# Patient Record
Sex: Male | Born: 1970 | Race: White | Hispanic: No | Marital: Single | Smoking: Current every day smoker
Health system: Southern US, Community
[De-identification: ages and names within clinical notes are randomized; demographics above are authoritative.]

## PROBLEM LIST (undated history)

## (undated) DIAGNOSIS — F429 Obsessive-compulsive disorder, unspecified: Secondary | ICD-10-CM

## (undated) DIAGNOSIS — F319 Bipolar disorder, unspecified: Secondary | ICD-10-CM

## (undated) DIAGNOSIS — F329 Major depressive disorder, single episode, unspecified: Secondary | ICD-10-CM

## (undated) DIAGNOSIS — F32A Depression, unspecified: Secondary | ICD-10-CM

## (undated) DIAGNOSIS — F419 Anxiety disorder, unspecified: Secondary | ICD-10-CM

## (undated) HISTORY — DX: Bipolar disorder, unspecified: F31.9

## (undated) HISTORY — DX: Obsessive-compulsive disorder, unspecified: F42.9

---

## 2014-03-01 ENCOUNTER — Encounter (HOSPITAL_COMMUNITY): Payer: Self-pay | Admitting: *Deleted

## 2014-03-01 ENCOUNTER — Emergency Department (HOSPITAL_COMMUNITY)
Admission: EM | Admit: 2014-03-01 | Discharge: 2014-03-01 | Disposition: A | Discharge status: Home / Self Care | Payer: Self-pay | Attending: Emergency Medicine | Admitting: Emergency Medicine

## 2014-03-01 DIAGNOSIS — Z79899 Other long term (current) drug therapy: Secondary | ICD-10-CM | POA: Insufficient documentation

## 2014-03-01 DIAGNOSIS — G8929 Other chronic pain: Secondary | ICD-10-CM | POA: Insufficient documentation

## 2014-03-01 DIAGNOSIS — S39012A Strain of muscle, fascia and tendon of lower back, initial encounter: Secondary | ICD-10-CM | POA: Insufficient documentation

## 2014-03-01 DIAGNOSIS — X58XXXA Exposure to other specified factors, initial encounter: Secondary | ICD-10-CM | POA: Insufficient documentation

## 2014-03-01 DIAGNOSIS — Y9389 Activity, other specified: Secondary | ICD-10-CM | POA: Insufficient documentation

## 2014-03-01 DIAGNOSIS — Y9289 Other specified places as the place of occurrence of the external cause: Secondary | ICD-10-CM | POA: Insufficient documentation

## 2014-03-01 DIAGNOSIS — Y998 Other external cause status: Secondary | ICD-10-CM | POA: Insufficient documentation

## 2014-03-01 DIAGNOSIS — Z720 Tobacco use: Secondary | ICD-10-CM | POA: Insufficient documentation

## 2014-03-01 MED ORDER — KETOROLAC TROMETHAMINE 10 MG PO TABS
10.0000 mg | ORAL_TABLET | Freq: Once | ORAL | Status: AC
  Administered 2014-03-01: 10 mg via ORAL
  Filled 2014-03-01: qty 1

## 2014-03-01 MED ORDER — BACLOFEN 10 MG PO TABS: 10.0000 mg | ORAL_TABLET | Freq: Three times a day (TID) | ORAL | Status: AC

## 2014-03-01 MED ORDER — DICLOFENAC SODIUM 75 MG PO TBEC: 75.0000 mg | DELAYED_RELEASE_TABLET | Freq: Two times a day (BID) | ORAL | Status: DC

## 2014-03-01 MED ORDER — METHOCARBAMOL 500 MG PO TABS
1000.0000 mg | ORAL_TABLET | Freq: Once | ORAL | Status: AC
  Administered 2014-03-01: 1000 mg via ORAL
  Filled 2014-03-01: qty 2

## 2014-03-01 MED ORDER — DEXAMETHASONE SODIUM PHOSPHATE 4 MG/ML IJ SOLN
8.0000 mg | Freq: Once | INTRAMUSCULAR | Status: AC
  Administered 2014-03-01: 8 mg via INTRAMUSCULAR
  Filled 2014-03-01: qty 2

## 2014-03-01 NOTE — Discharge Instructions (Signed)
Please rest your back is much as possible. Please apply heating pad anterior back, or soak in a tub of warm water 2 times daily for 15-20 minutes each. Please use diclofenac 2 times daily with food. Use baclofen 3 times daily for spasm. May use Tylenol in between the doses of diclofenac if needed. Please see the orthopedic specialist listed above if not improving. Lumbosacral Strain Lumbosacral strain is a strain of any of the parts that make up your lumbosacral vertebrae. Your lumbosacral vertebrae are the bones that make up the lower third of your backbone. Your lumbosacral vertebrae are held together by muscles and tough, fibrous tissue (ligaments).  CAUSES  A sudden blow to your back can cause lumbosacral strain. Also, anything that causes an excessive stretch of the muscles in the low back can cause this strain. This is typically seen when people exert themselves strenuously, fall, lift heavy objects, bend, or crouch repeatedly. RISK FACTORS  Physically demanding work.  Participation in pushing or pulling sports or sports that require a sudden twist of the back (tennis, golf, baseball).  Weight lifting.  Excessive lower back curvature.  Forward-tilted pelvis.  Weak back or abdominal muscles or both.  Tight hamstrings. SIGNS AND SYMPTOMS  Lumbosacral strain may cause pain in the area of your injury or pain that moves (radiates) down your leg.  DIAGNOSIS Your health care provider can often diagnose lumbosacral strain through a physical exam. In some cases, you may need tests such as X-ray exams.  TREATMENT  Treatment for your lower back injury depends on many factors that your clinician will have to evaluate. However, most treatment will include the use of anti-inflammatory medicines. HOME CARE INSTRUCTIONS   Avoid hard physical activities (tennis, racquetball, waterskiing) if you are not in proper physical condition for it. This may aggravate or create problems.  If you have a back  problem, avoid sports requiring sudden body movements. Swimming and walking are generally safer activities.  Maintain good posture.  Maintain a healthy weight.  For acute conditions, you may put ice on the injured area.  Put ice in a plastic bag.  Place a towel between your skin and the bag.  Leave the ice on for 20 minutes, 2-3 times a day.  When the low back starts healing, stretching and strengthening exercises may be recommended. SEEK MEDICAL CARE IF:  Your back pain is getting worse.  You experience severe back pain not relieved with medicines. SEEK IMMEDIATE MEDICAL CARE IF:   You have numbness, tingling, weakness, or problems with the use of your arms or legs.  There is a change in bowel or bladder control.  You have increasing pain in any area of the body, including your belly (abdomen).  You notice shortness of breath, dizziness, or feel faint.  You feel sick to your stomach (nauseous), are throwing up (vomiting), or become sweaty.  You notice discoloration of your toes or legs, or your feet get very cold. MAKE SURE YOU:   Understand these instructions.  Will watch your condition.  Will get help right away if you are not doing well or get worse. Document Released: 11/06/2004 Document Revised: 02/01/2013 Document Reviewed: 09/15/2012 [facility] Patient Information 2015 [place], [place]. This information is not intended to replace advice given to you by your health care provider. Make sure you discuss any questions you have with your health care provider.

## 2014-03-01 NOTE — ED Notes (Signed)
Pt states lower back pain x 2 days.

## 2014-03-01 NOTE — ED Provider Notes (Signed)
CSN: [id number]     Arrival date & time 03/01/14  1112 History   First MD Initiated Contact with Patient 03/01/14 1142     Chief Complaint  Patient presents with  . Back Pain     (Consider location/radiation/quality/duration/timing/severity/associated sxs/prior Treatment) Patient is a 44 y.o. male presenting with back pain. The history is provided by the patient.  Back Pain Location:  Lumbar spine Quality:  Stabbing Radiates to:  Does not radiate Pain severity:  Severe Pain is:  Same all the time Onset quality:  Gradual Timing:  Intermittent Progression:  Worsening Chronicity: acute on chronic. Context comment:  Hx of lumbar disease. Pt bent over for an object an injured his back. Relieved by:  Nothing Worsened by:  Movement Ineffective treatments:  None tried Associated symptoms: no abdominal pain, no bladder incontinence, no bowel incontinence, no chest pain, no dysuria and no perianal numbness   Risk factors: no recent surgery     History reviewed. No pertinent past medical history. History reviewed. No pertinent past surgical history. No family history on file. History  Substance Use Topics  . Smoking status: Current Every Day Smoker    Types: Cigarettes  . Smokeless tobacco: Not on file  . Alcohol Use: No    Review of Systems  Constitutional: Negative for activity change.       All ROS Neg except as noted in HPI  Eyes: Negative for photophobia and discharge.  Respiratory: Negative for cough, shortness of breath and wheezing.   Cardiovascular: Negative for chest pain and palpitations.  Gastrointestinal: Negative for abdominal pain, blood in stool and bowel incontinence.  Genitourinary: Negative for bladder incontinence, dysuria, frequency and hematuria.  Musculoskeletal: Positive for back pain. Negative for arthralgias and neck pain.  Skin: Negative.   Neurological: Negative for dizziness, seizures and speech difficulty.  Psychiatric/Behavioral: Negative for  hallucinations and confusion.      Allergies  Review of patient's allergies indicates no known allergies.  Home Medications   Prior to Admission medications   Medication Sig Start Date End Date Taking? Authorizing Provider  gabapentin (NEURONTIN) 800 MG tablet Take 800 mg by mouth 4 (four) times daily.   Yes Historical Provider, MD  hydrocortisone cream 1 % Apply 1 application topically daily as needed for itching.   Yes Historical Provider, MD  hydrOXYzine (VISTARIL) 50 MG capsule Take 50 mg by mouth every 4 (four) hours as needed for anxiety.   Yes Historical Provider, MD  lamoTRIgine (LAMICTAL) 100 MG tablet Take 300 mg by mouth at bedtime.    Yes Historical Provider, MD  lithium carbonate 300 MG capsule Take 300 mg by mouth 4 (four) times daily.   Yes Historical Provider, MD  Oxcarbazepine (TRILEPTAL) 300 MG tablet Take 300 mg by mouth daily.   Yes Historical Provider, MD  QUEtiapine (SEROQUEL XR) 400 MG 24 hr tablet Take 400 mg by mouth at bedtime.   Yes Historical Provider, MD  thiothixene (NAVANE) 10 MG capsule Take 10 mg by mouth at bedtime.   Yes Historical Provider, MD   BP 121/69 mmHg  Pulse 82  Temp(Src) 98 F (36.7 C) (Oral)  Resp 16  Ht 5\' 11"  (1.803 m)  Wt 175 lb (79.379 kg)  BMI 24.42 kg/m2  SpO2 100% Physical Exam  Constitutional: He is oriented to person, place, and time. He appears well-developed and well-nourished.  Non-toxic appearance.  HENT:  Head: Normocephalic.  Right Ear: Tympanic membrane and external ear normal.  Left Ear: Tympanic membrane and external  ear normal.  Eyes: EOM and lids are normal. Pupils are equal, round, and reactive to light.  Neck: Normal range of motion. Neck supple. Carotid bruit is not present.  Cardiovascular: Normal rate, regular rhythm, normal heart sounds, intact distal pulses and normal pulses.   Pulmonary/Chest: Breath sounds normal. No respiratory distress.  Abdominal: Soft. Bowel sounds are normal. There is no  tenderness. There is no guarding.  Musculoskeletal:       Lumbar back: He exhibits decreased range of motion, tenderness, pain and spasm. He exhibits no deformity and no laceration.       Arms: Lymphadenopathy:       Head (right side): No submandibular adenopathy present.       Head (left side): No submandibular adenopathy present.    He has no cervical adenopathy.  Neurological: He is alert and oriented to person, place, and time. He has normal strength. No cranial nerve deficit or sensory deficit.  Skin: Skin is warm and dry.  Psychiatric: He has a normal mood and affect. His speech is normal.  Nursing note and vitals reviewed.   ED Course  Procedures (including critical care time) Labs Review Labs Reviewed - No data to display  Imaging Review No results found.   EKG Interpretation None      MDM  Vital signs within normal limits. Examination suggest lumbar strain, acute on chronic pain. The patient will be treated with diclofenac and baclofen. I've asked the patient to use heat, and to rest the back is much possible. Patient will see Dr. Luiz Blare (orthopedics) for additional evaluation and management if not improving.    Final diagnoses:  None    *I have reviewed nursing notes, vital signs, and all appropriate lab and imaging results for this patient.    Kathie Dike, PA-C 03/01/14 1250  Vanetta Mulders, MD 03/03/14 [phone]

## 2016-02-06 ENCOUNTER — Encounter (HOSPITAL_COMMUNITY): Payer: Self-pay | Admitting: Emergency Medicine

## 2016-02-06 ENCOUNTER — Emergency Department (HOSPITAL_COMMUNITY): Payer: Self-pay

## 2016-02-06 ENCOUNTER — Emergency Department (HOSPITAL_COMMUNITY)
Admission: EM | Admit: 2016-02-06 | Discharge: 2016-02-06 | Disposition: A | Discharge status: Home / Self Care | Payer: Self-pay | Attending: Emergency Medicine | Admitting: Emergency Medicine

## 2016-02-06 DIAGNOSIS — J189 Pneumonia, unspecified organism: Secondary | ICD-10-CM | POA: Insufficient documentation

## 2016-02-06 DIAGNOSIS — Z87891 Personal history of nicotine dependence: Secondary | ICD-10-CM | POA: Insufficient documentation

## 2016-02-06 DIAGNOSIS — Z79899 Other long term (current) drug therapy: Secondary | ICD-10-CM | POA: Insufficient documentation

## 2016-02-06 MED ORDER — AMOXICILLIN 250 MG PO CAPS
1000.0000 mg | ORAL_CAPSULE | Freq: Once | ORAL | Status: AC
  Administered 2016-02-06: 1000 mg via ORAL
  Filled 2016-02-06: qty 4

## 2016-02-06 MED ORDER — AZITHROMYCIN 250 MG PO TABS: 250.0000 mg | ORAL_TABLET | Freq: Every day | ORAL | 0 refills | Status: DC

## 2016-02-06 MED ORDER — AMOXICILLIN 500 MG PO CAPS: 500.0000 mg | ORAL_CAPSULE | Freq: Three times a day (TID) | ORAL | 0 refills | Status: DC

## 2016-02-06 MED ORDER — AZITHROMYCIN 250 MG PO TABS
500.0000 mg | ORAL_TABLET | Freq: Once | ORAL | Status: AC
  Administered 2016-02-06: 500 mg via ORAL
  Filled 2016-02-06: qty 2

## 2016-02-06 NOTE — ED Provider Notes (Signed)
[facility]EMER[facility]Y DEPT Provider Note   CSN: [id number] Arrival date & time: 02/06/16  1152    By signing my name below, I, Chad Booth, attest that this documentation has been prepared under the direction and in the presence of [name], MD. Electronically Signed: Valentino SaxonBianca Booth, ED Scribe. 02/06/16. 1:21 PM.  History   Chief Complaint Chief Complaint  Patient presents with  . Cough   The history is provided by the patient. No language interpreter was used.   HPI Comments: Chad Chad Booth is a 45 y.o. male who presents to the Emergency Department complaining of moderate, frequent cough onset two weeks ago. He reports associated fatigue, HA, cold sweats and dizziness. Pt also reports post-tussive emesis. He notes taking 6 hour naps with no relief for fatigue. Pt also states having a sour sensation in his stomach. Pt reports unexpected weight loss, resulting in shedding 15 lbs. No alleviating factors noted. Pt notes his daily medication makes him feel drowsy. Per pt, he notes Hx of pneumonia. He denies fever, CP, SOB, congestion, nausea, diarrhea. Pt notes he quit smoking two weeks ago. NKDA  History reviewed. No pertinent past medical history.  There are no active problems to display for this patient.   History reviewed. No pertinent surgical history.     Home Medications    Prior to Admission medications   Medication Sig Start Date End Date Taking? Authorizing Provider  busPIRone (BUSPAR) 10 MG tablet Take 10 mg by mouth 3 (three) times daily. 01/06/16  Yes Historical Provider, MD  gabapentin (NEURONTIN) 400 MG capsule Take 800 mg by mouth 4 (four) times daily. 12/18/15  Yes Historical Provider, MD  hydrOXYzine (ATARAX/VISTARIL) 50 MG tablet Take 50 mg by mouth 3 (three) times daily as needed for anxiety.  12/18/15  Yes Historical Provider, MD  lamoTRIgine (LAMICTAL) 200 MG tablet Take 400 mg by mouth at bedtime. 12/18/15  Yes Historical Provider, MD  PROZAC 20 MG  capsule Take 80 mg by mouth daily.  01/06/16  Yes Historical Provider, MD  QUEtiapine (SEROQUEL XR) 300 MG 24 hr tablet Take 900 mg by mouth at bedtime.   Yes Historical Provider, MD  diclofenac (VOLTAREN) 75 MG EC tablet Take 1 tablet (75 mg total) by mouth 2 (two) times daily. 03/01/14   Ivery QualeHobson Bryant, PA-C  gabapentin (NEURONTIN) 800 MG tablet Take 800 mg by mouth 4 (four) times daily.    Historical Provider, MD  hydrocortisone cream 1 % Apply 1 application topically daily as needed for itching.    Historical Provider, MD  hydrOXYzine (VISTARIL) 50 MG capsule Take 50 mg by mouth every 4 (four) hours as needed for anxiety.    Historical Provider, MD  lamoTRIgine (LAMICTAL) 100 MG tablet Take 300 mg by mouth at bedtime.     Historical Provider, MD  lithium carbonate 300 MG capsule Take 300 mg by mouth 4 (four) times daily.    Historical Provider, MD  Oxcarbazepine (TRILEPTAL) 300 MG tablet Take 300 mg by mouth daily.    Historical Provider, MD  QUEtiapine (SEROQUEL XR) 400 MG 24 hr tablet Take 400 mg by mouth at bedtime.    Historical Provider, MD  thiothixene (NAVANE) 10 MG capsule Take 10 mg by mouth at bedtime.    Historical Provider, MD    Family History No family history on file.  Social History Social History  Substance Use Topics  . Smoking status: Former Smoker    Types: Cigarettes    Quit date: 01/23/2016  . Smokeless  tobacco: Never Used  . Alcohol use No     Allergies   Patient has no known allergies.   Review of Systems Review of Systems  Constitutional: Positive for fatigue and unexpected weight change. Negative for fever.  HENT: Negative for congestion.   Respiratory: Positive for cough. Negative for shortness of breath.   Cardiovascular: Negative for chest pain.  Gastrointestinal: Positive for vomiting. Negative for nausea.  Neurological: Positive for dizziness and headaches.  All other systems reviewed and are negative.    Physical Exam Updated Vital  Signs BP 107/70 (BP Location: Right Arm)   Pulse 76   Temp 97.4 F (36.3 C) (Oral)   Resp 18   Ht 5\' 11"  (1.803 m)   Wt 155 lb (70.3 kg)   SpO2 94%   BMI 21.62 kg/m   Physical Exam  Constitutional: He appears well-developed and well-nourished. No distress.  HENT:  Head: Normocephalic and atraumatic.  Mouth/Throat: Oropharynx is clear and moist. No oropharyngeal exudate.  Eyes: Conjunctivae and EOM are normal. Pupils are equal, round, and reactive to light. Right eye exhibits no discharge. Left eye exhibits no discharge. No scleral icterus.  Neck: Normal range of motion. Neck supple. No JVD present. No thyromegaly present.  Cardiovascular: Normal rate, regular rhythm, normal heart sounds and intact distal pulses.  Exam reveals no gallop and no friction rub.   No murmur heard. Pulmonary/Chest: Effort normal and breath sounds normal. No respiratory distress. He has no wheezes. He has no rales.  Abdominal: Soft. Bowel sounds are normal. He exhibits no distension and no mass. There is no tenderness.  Musculoskeletal: Normal range of motion. He exhibits no edema or tenderness.  Lymphadenopathy:    He has no cervical adenopathy.  Neurological: He is alert. Coordination normal.  Skin: Skin is warm and dry. No rash noted. No erythema.  Psychiatric: He has a normal mood and affect. His behavior is normal.  Nursing note and vitals reviewed.    ED Treatments / Results   DIAGNOSTIC STUDIES: Oxygen Saturation is 94% on RA, normal by my interpretation.    COORDINATION OF CARE: 1:16 PM Discussed treatment plan with pt at bedside which includes chest imaging, penicillin antibiotic and pt agreed to plan.   Labs (all labs ordered are listed, but only abnormal results are displayed) Labs Reviewed - No data to display  EKG  EKG Interpretation None       Radiology Dg Chest 2 View  Result Date: 02/06/2016 CLINICAL DATA:  Cough for 2 weeks. EXAM: CHEST  2 VIEW COMPARISON:  None.  FINDINGS: Mild opacity in the right lung base. The heart, hila, mediastinum, lungs, and pleura are otherwise normal. IMPRESSION: Mild opacity in the right lung base could represent subtle infiltrate. Recommend follow-up to resolution. No other acute abnormalities. Electronically Signed   By: Gerome Sam III M.D   On: 02/06/2016 12:38    Procedures Procedures (including critical care time)  Medications Ordered in ED Medications  amoxicillin (AMOXIL) capsule 1,000 mg (not administered)  azithromycin (ZITHROMAX) tablet 500 mg (not administered)     Initial Impression / Assessment and Plan / ED Course  I have reviewed the triage vital signs and the nursing notes.  Pertinent labs & imaging results that were available during my care of the patient were reviewed by me and considered in my medical decision making (see chart for details).  Clinical Course     45 year old male with cough. Chest x-ray with possible pneumonia. Will treat for any prior  pneumonia. Is afebrile. His work of breathing is not simply increased. His oxygen saturations are adequate. I feel is appropriate for outpatient treatment. Return precautions were discussed. Outpatient follow-up otherwise.  Final Clinical Impressions(s) / ED Diagnoses   Final diagnoses:  Community acquired pneumonia of right lung, unspecified part of lung    New Prescriptions New Prescriptions   No medications on file    I personally preformed the services scribed in my presence. The recorded information has been reviewed is accurate. Raeford RazorStephen Lateshia Schmoker, MD.     Raeford RazorStephen Diego Delancey, MD 02/08/16 [id number]

## 2016-02-06 NOTE — ED Triage Notes (Signed)
Patient complains of cough and fatigue. States history of pneumonia. States cold sweats.

## 2016-02-20 ENCOUNTER — Emergency Department (HOSPITAL_COMMUNITY): Payer: Self-pay

## 2016-02-20 ENCOUNTER — Emergency Department (HOSPITAL_COMMUNITY)
Admission: EM | Admit: 2016-02-20 | Discharge: 2016-02-20 | Disposition: A | Discharge status: Home / Self Care | Payer: Self-pay | Attending: Emergency Medicine | Admitting: Emergency Medicine

## 2016-02-20 ENCOUNTER — Encounter (HOSPITAL_COMMUNITY): Payer: Self-pay | Admitting: Emergency Medicine

## 2016-02-20 DIAGNOSIS — Z87891 Personal history of nicotine dependence: Secondary | ICD-10-CM | POA: Insufficient documentation

## 2016-02-20 DIAGNOSIS — J189 Pneumonia, unspecified organism: Secondary | ICD-10-CM | POA: Insufficient documentation

## 2016-02-20 LAB — CBG MONITORING, ED: GLUCOSE-CAPILLARY: 83 mg/dL (ref 65–99)

## 2016-02-20 MED ORDER — DOXYCYCLINE HYCLATE 100 MG PO CAPS: 100.0000 mg | ORAL_CAPSULE | Freq: Two times a day (BID) | ORAL | 0 refills | Status: DC

## 2016-02-20 NOTE — ED Provider Notes (Signed)
[facility] Provider Note   CSN: [id number] Arrival date & time: 02/20/16  1506     History   Chief Complaint Chief Complaint  Patient presents with  . Shortness of Breath    HPI Chad Booth is a 46 y.o. male.  HPI Patient presents with shortness of breath. Seen in the ER 2 weeks ago and diagnosed with pneumonia that time. Given amoxicillin and azithromycin. States he was feeling little better then began to feel worse. Has had some weakness fatigue. States he is only here because his boss told him to come in. States he is a little shaky both because of stress because his sugar is low. He is not diabetic. No fevers or chills. He is a smoker and briefly stop smoking but states he started to smoke again. No swelling in his legs.   History reviewed. No pertinent past medical history.  There are no active problems to display for this patient.   History reviewed. No pertinent surgical history.     Home Medications    Prior to Admission medications   Medication Sig Start Date End Date Taking? Authorizing Provider  amoxicillin (AMOXIL) 500 MG capsule Take 1 capsule (500 mg total) by mouth 3 (three) times daily. 02/06/16   Raeford Razor, MD  azithromycin (ZITHROMAX) 250 MG tablet Take 1 tablet (250 mg total) by mouth daily. Take first 2 tablets together, then 1 every day until finished. 02/06/16   Raeford Razor, MD  busPIRone (BUSPAR) 10 MG tablet Take 10 mg by mouth 3 (three) times daily. 01/06/16   Historical Provider, MD  diclofenac (VOLTAREN) 75 MG EC tablet Take 1 tablet (75 mg total) by mouth 2 (two) times daily. Patient not taking: Reported on 02/06/2016 03/01/14   Ivery Quale, PA-C  doxycycline (VIBRAMYCIN) 100 MG capsule Take 1 capsule (100 mg total) by mouth 2 (two) times daily. 02/20/16   Benjiman Core, MD  gabapentin (NEURONTIN) 400 MG capsule Take 800 mg by mouth 4 (four) times daily. 12/18/15   Historical Provider, MD  hydrOXYzine (ATARAX/VISTARIL) 50  MG tablet Take 50 mg by mouth 3 (three) times daily as needed for anxiety.  12/18/15   Historical Provider, MD  lamoTRIgine (LAMICTAL) 200 MG tablet Take 400 mg by mouth at bedtime. 12/18/15   Historical Provider, MD  PROZAC 20 MG capsule Take 80 mg by mouth daily.  01/06/16   Historical Provider, MD  QUEtiapine (SEROQUEL XR) 300 MG 24 hr tablet Take 900 mg by mouth at bedtime.    Historical Provider, MD    Family History History reviewed. No pertinent family history.  Social History Social History  Substance Use Topics  . Smoking status: Former Smoker    Types: Cigarettes    Quit date: 01/23/2016  . Smokeless tobacco: Never Used  . Alcohol use No     Allergies   Patient has no known allergies.   Review of Systems Review of Systems  Constitutional: Positive for appetite change and fatigue.  Eyes: Negative for visual disturbance.  Respiratory: Positive for cough and shortness of breath.   Cardiovascular: Negative for chest pain.  Gastrointestinal: Negative for abdominal pain.  Genitourinary: Negative for enuresis.  Musculoskeletal: Negative for back pain.  Neurological: Negative for numbness.  Psychiatric/Behavioral: Negative for confusion.     Physical Exam Updated Vital Signs BP (!) 113/43 (BP Location: Left Arm)   Pulse 83   Temp 98.1 F (36.7 C) (Oral)   Resp 18   Ht 5\' 11"  (1.803 m)  Wt 165 lb (74.8 kg)   SpO2 100%   BMI 23.01 kg/m   Physical Exam  Constitutional: He appears well-developed.  HENT:  Head: Atraumatic.  Eyes: EOM are normal.  Neck: Neck supple.  Cardiovascular: Normal rate.   Pulmonary/Chest: Effort normal. No respiratory distress.  Mildly harsh breath sounds without focal rales or rhonchi.  Musculoskeletal: He exhibits no edema.  Neurological: He is alert.  Skin: Skin is warm. Capillary refill takes less than 2 seconds.     ED Treatments / Results  Labs (all labs ordered are listed, but only abnormal results are displayed) Labs  Reviewed  CBG MONITORING, ED    EKG  EKG Interpretation None       Radiology Dg Chest 2 View  Result Date: 02/20/2016 CLINICAL DATA:  Recent pneumonia with persistent shortness of breath and cough EXAM: CHEST  2 VIEW COMPARISON:  February 06, 2016 FINDINGS: Patchy opacity remains in the right base, slightly less than on prior study. There is subtle increased opacity in the left perihilar region. Lungs elsewhere are clear. Heart size and pulmonary vascularity are normal. No adenopathy. No bone lesions. IMPRESSION: Patchy opacity in the right base, slightly less than on prior study. New small area of air patchy opacity left perihilar region. Suspect a degree of pneumonia in each of these areas. Lungs elsewhere clear. Stable cardiac silhouette. Followup PA and lateral chest radiographs recommended in 3-4 weeks following trial of antibiotic therapy to ensure resolution and exclude underlying malignancy. Electronically Signed   By: Bretta BangWilliam  Woodruff III M.D.   On: 02/20/2016 16:08    Procedures Procedures (including critical care time)  Medications Ordered in ED Medications - No data to display   Initial Impression / Assessment and Plan / ED Course  I have reviewed the triage vital signs and the nursing notes.  Pertinent labs & imaging results that were available during my care of the patient were reviewed by me and considered in my medical decision making (see chart for details).  Clinical Course   Patient with cough and some sputum production. Not feeling better after previous pneumonia treatment. Had amoxicillin and azithromycin. We'll now change to doxycycline since x-ray shows possible new pneumonia in the left perihilar area. Well-appearing and will discharge home.  Final Clinical Impressions(s) / ED Diagnoses   Final diagnoses:  Community acquired pneumonia, unspecified laterality    New Prescriptions New Prescriptions   DOXYCYCLINE (VIBRAMYCIN) 100 MG CAPSULE    Take 1  capsule (100 mg total) by mouth 2 (two) times daily.     Benjiman CoreNathan Quantia Grullon, MD 02/20/16 1905

## 2016-02-20 NOTE — ED Triage Notes (Signed)
C/o cough for one month.  Seen here in Ed 2 weeks ago and treated antibiotics for right lung PNA.  Pt says he is not getting any better.  C/o weakness and fatigue.

## 2016-03-12 ENCOUNTER — Emergency Department (HOSPITAL_COMMUNITY): Payer: Self-pay

## 2016-03-12 ENCOUNTER — Inpatient Hospital Stay (HOSPITAL_COMMUNITY)
Admission: EM | Admit: 2016-03-12 | Discharge: 2016-03-15 | DRG: 193 | Disposition: A | Discharge status: Home / Self Care | Payer: Self-pay | Attending: Family Medicine | Admitting: Family Medicine

## 2016-03-12 ENCOUNTER — Encounter (HOSPITAL_COMMUNITY): Payer: Self-pay | Admitting: Emergency Medicine

## 2016-03-12 DIAGNOSIS — F17210 Nicotine dependence, cigarettes, uncomplicated: Secondary | ICD-10-CM | POA: Diagnosis present

## 2016-03-12 DIAGNOSIS — Z8249 Family history of ischemic heart disease and other diseases of the circulatory system: Secondary | ICD-10-CM

## 2016-03-12 DIAGNOSIS — R42 Dizziness and giddiness: Secondary | ICD-10-CM | POA: Diagnosis present

## 2016-03-12 DIAGNOSIS — Z79899 Other long term (current) drug therapy: Secondary | ICD-10-CM

## 2016-03-12 DIAGNOSIS — R059 Cough, unspecified: Secondary | ICD-10-CM

## 2016-03-12 DIAGNOSIS — F419 Anxiety disorder, unspecified: Secondary | ICD-10-CM | POA: Diagnosis present

## 2016-03-12 DIAGNOSIS — R0902 Hypoxemia: Secondary | ICD-10-CM

## 2016-03-12 DIAGNOSIS — J189 Pneumonia, unspecified organism: Principal | ICD-10-CM | POA: Diagnosis present

## 2016-03-12 DIAGNOSIS — F17200 Nicotine dependence, unspecified, uncomplicated: Secondary | ICD-10-CM | POA: Diagnosis present

## 2016-03-12 DIAGNOSIS — F1910 Other psychoactive substance abuse, uncomplicated: Secondary | ICD-10-CM | POA: Diagnosis present

## 2016-03-12 DIAGNOSIS — G629 Polyneuropathy, unspecified: Secondary | ICD-10-CM | POA: Diagnosis present

## 2016-03-12 DIAGNOSIS — F603 Borderline personality disorder: Secondary | ICD-10-CM | POA: Diagnosis present

## 2016-03-12 DIAGNOSIS — J9601 Acute respiratory failure with hypoxia: Secondary | ICD-10-CM | POA: Diagnosis present

## 2016-03-12 DIAGNOSIS — F319 Bipolar disorder, unspecified: Secondary | ICD-10-CM | POA: Diagnosis present

## 2016-03-12 DIAGNOSIS — I2510 Atherosclerotic heart disease of native coronary artery without angina pectoris: Secondary | ICD-10-CM | POA: Diagnosis present

## 2016-03-12 DIAGNOSIS — D649 Anemia, unspecified: Secondary | ICD-10-CM | POA: Diagnosis present

## 2016-03-12 DIAGNOSIS — F329 Major depressive disorder, single episode, unspecified: Secondary | ICD-10-CM | POA: Diagnosis present

## 2016-03-12 DIAGNOSIS — F1021 Alcohol dependence, in remission: Secondary | ICD-10-CM | POA: Diagnosis present

## 2016-03-12 DIAGNOSIS — J9801 Acute bronchospasm: Secondary | ICD-10-CM

## 2016-03-12 DIAGNOSIS — R45851 Suicidal ideations: Secondary | ICD-10-CM | POA: Diagnosis present

## 2016-03-12 DIAGNOSIS — R05 Cough: Secondary | ICD-10-CM

## 2016-03-12 DIAGNOSIS — J440 Chronic obstructive pulmonary disease with acute lower respiratory infection: Secondary | ICD-10-CM | POA: Diagnosis present

## 2016-03-12 HISTORY — DX: Major depressive disorder, single episode, unspecified: F32.9

## 2016-03-12 HISTORY — DX: Depression, unspecified: F32.A

## 2016-03-12 HISTORY — DX: Anxiety disorder, unspecified: F41.9

## 2016-03-12 LAB — CBC WITH DIFFERENTIAL/PLATELET
BASOS ABS: 0 10*3/uL (ref 0.0–0.1)
Basophils Relative: 0 %
EOS PCT: 0 %
Eosinophils Absolute: 0 10*3/uL (ref 0.0–0.7)
HEMATOCRIT: 30.7 % — AB (ref 39.0–52.0)
Hemoglobin: 10.2 g/dL — ABNORMAL LOW (ref 13.0–17.0)
LYMPHS PCT: 11 %
Lymphs Abs: 1 10*3/uL (ref 0.7–4.0)
MCH: 27.5 pg (ref 26.0–34.0)
MCHC: 33.2 g/dL (ref 30.0–36.0)
MCV: 82.7 fL (ref 78.0–100.0)
Monocytes Absolute: 0.5 10*3/uL (ref 0.1–1.0)
Monocytes Relative: 5 %
NEUTROS ABS: 8.1 10*3/uL — AB (ref 1.7–7.7)
Neutrophils Relative %: 84 %
PLATELETS: 218 10*3/uL (ref 150–400)
RBC: 3.71 MIL/uL — AB (ref 4.22–5.81)
RDW: 16.8 % — ABNORMAL HIGH (ref 11.5–15.5)
WBC: 9.6 10*3/uL (ref 4.0–10.5)

## 2016-03-12 LAB — RAPID URINE DRUG SCREEN, HOSP PERFORMED
Amphetamines: NOT DETECTED
BARBITURATES: NOT DETECTED
BENZODIAZEPINES: NOT DETECTED
COCAINE: NOT DETECTED
OPIATES: POSITIVE — AB
Tetrahydrocannabinol: NOT DETECTED

## 2016-03-12 LAB — BASIC METABOLIC PANEL
Anion gap: 8 (ref 5–15)
BUN: 13 mg/dL (ref 6–20)
CHLORIDE: 102 mmol/L (ref 101–111)
CO2: 24 mmol/L (ref 22–32)
CREATININE: 1.13 mg/dL (ref 0.61–1.24)
Calcium: 9.1 mg/dL (ref 8.9–10.3)
GFR calc Af Amer: >60 mL/min (ref >60)
GFR calc non Af Amer: >60 mL/min (ref >60)
Glucose, Bld: 94 mg/dL (ref 65–99)
Potassium: 3.8 mmol/L (ref 3.5–5.1)
Sodium: 134 mmol/L — ABNORMAL LOW (ref 135–145)

## 2016-03-12 LAB — INFLUENZA PANEL BY PCR (TYPE A & B)
INFLAPCR: NEGATIVE
Influenza B By PCR: NEGATIVE

## 2016-03-12 LAB — CBG MONITORING, ED: Glucose-Capillary: 97 mg/dL (ref 65–99)

## 2016-03-12 LAB — TROPONIN I

## 2016-03-12 LAB — LACTIC ACID, PLASMA
Lactic Acid, Venous: 0.9 mmol/L (ref 0.5–1.9)
Lactic Acid, Venous: 2.1 mmol/L (ref 0.5–1.9)

## 2016-03-12 MED ORDER — VANCOMYCIN HCL IN DEXTROSE 750-5 MG/150ML-% IV SOLN
750.0000 mg | Freq: Three times a day (TID) | INTRAVENOUS | Status: DC
  Administered 2016-03-13 – 2016-03-15 (×7): 750 mg via INTRAVENOUS
  Filled 2016-03-12 (×15): qty 150

## 2016-03-12 MED ORDER — IBUPROFEN 600 MG PO TABS
600.0000 mg | ORAL_TABLET | Freq: Four times a day (QID) | ORAL | Status: DC | PRN
Start: 2016-03-12 — End: 2016-03-15
  Filled 2016-03-12: qty 1

## 2016-03-12 MED ORDER — IPRATROPIUM-ALBUTEROL 0.5-2.5 (3) MG/3ML IN SOLN
3.0000 mL | Freq: Four times a day (QID) | RESPIRATORY_TRACT | Status: DC
  Administered 2016-03-13 – 2016-03-14 (×4): 3 mL via RESPIRATORY_TRACT
  Filled 2016-03-12 (×4): qty 3

## 2016-03-12 MED ORDER — LAMOTRIGINE 100 MG PO TABS
400.0000 mg | ORAL_TABLET | Freq: Every day | ORAL | Status: DC
  Administered 2016-03-12 – 2016-03-14 (×3): 400 mg via ORAL
  Filled 2016-03-12 (×3): qty 4

## 2016-03-12 MED ORDER — ENOXAPARIN SODIUM 40 MG/0.4ML ~~LOC~~ SOLN
40.0000 mg | SUBCUTANEOUS | Status: DC
  Administered 2016-03-12 – 2016-03-14 (×3): 40 mg via SUBCUTANEOUS
  Filled 2016-03-12 (×3): qty 0.4

## 2016-03-12 MED ORDER — DEXTROSE 5 % IV SOLN
1.0000 g | Freq: Three times a day (TID) | INTRAVENOUS | Status: DC
  Administered 2016-03-12 – 2016-03-15 (×8): 1 g via INTRAVENOUS
  Filled 2016-03-12 (×15): qty 1

## 2016-03-12 MED ORDER — SODIUM CHLORIDE 0.9 % IV BOLUS (SEPSIS)
1000.0000 mL | Freq: Once | INTRAVENOUS | Status: AC
  Administered 2016-03-12: 1000 mL via INTRAVENOUS

## 2016-03-12 MED ORDER — FLUOXETINE HCL 20 MG PO CAPS
80.0000 mg | ORAL_CAPSULE | Freq: Every day | ORAL | Status: DC
  Administered 2016-03-13 – 2016-03-15 (×3): 80 mg via ORAL
  Filled 2016-03-12 (×3): qty 4

## 2016-03-12 MED ORDER — BUSPIRONE HCL 5 MG PO TABS
10.0000 mg | ORAL_TABLET | Freq: Three times a day (TID) | ORAL | Status: DC
  Administered 2016-03-12 – 2016-03-15 (×8): 10 mg via ORAL
  Filled 2016-03-12 (×8): qty 2

## 2016-03-12 MED ORDER — VANCOMYCIN HCL IN DEXTROSE 1-5 GM/200ML-% IV SOLN
1000.0000 mg | Freq: Once | INTRAVENOUS | Status: AC
  Administered 2016-03-12: 1000 mg via INTRAVENOUS
  Filled 2016-03-12: qty 200

## 2016-03-12 MED ORDER — IPRATROPIUM-ALBUTEROL 0.5-2.5 (3) MG/3ML IN SOLN
3.0000 mL | Freq: Once | RESPIRATORY_TRACT | Status: AC
  Administered 2016-03-12: 3 mL via RESPIRATORY_TRACT
  Filled 2016-03-12: qty 3

## 2016-03-12 MED ORDER — LACTATED RINGERS IV SOLN
INTRAVENOUS | Status: DC
  Administered 2016-03-12 – 2016-03-14 (×3): via INTRAVENOUS

## 2016-03-12 MED ORDER — GABAPENTIN 400 MG PO CAPS
800.0000 mg | ORAL_CAPSULE | Freq: Four times a day (QID) | ORAL | Status: DC
  Administered 2016-03-12 – 2016-03-15 (×10): 800 mg via ORAL
  Filled 2016-03-12 (×10): qty 2

## 2016-03-12 MED ORDER — METHYLPREDNISOLONE SODIUM SUCC 125 MG IJ SOLR
125.0000 mg | Freq: Once | INTRAMUSCULAR | Status: AC
  Administered 2016-03-12: 125 mg via INTRAVENOUS
  Filled 2016-03-12: qty 2

## 2016-03-12 MED ORDER — ALBUTEROL SULFATE (2.5 MG/3ML) 0.083% IN NEBU
2.5000 mg | INHALATION_SOLUTION | RESPIRATORY_TRACT | Status: DC | PRN
  Administered 2016-03-12: 2.5 mg via RESPIRATORY_TRACT
  Filled 2016-03-12: qty 3

## 2016-03-12 MED ORDER — ALBUTEROL SULFATE (2.5 MG/3ML) 0.083% IN NEBU
2.5000 mg | INHALATION_SOLUTION | Freq: Once | RESPIRATORY_TRACT | Status: AC
  Administered 2016-03-12: 2.5 mg via RESPIRATORY_TRACT
  Filled 2016-03-12: qty 3

## 2016-03-12 MED ORDER — QUETIAPINE FUMARATE ER 300 MG PO TB24
900.0000 mg | ORAL_TABLET | Freq: Every day | ORAL | Status: DC
  Administered 2016-03-12 – 2016-03-14 (×3): 900 mg via ORAL
  Filled 2016-03-12 (×5): qty 3

## 2016-03-12 MED ORDER — ALBUTEROL SULFATE (2.5 MG/3ML) 0.083% IN NEBU: 2.5000 mg | INHALATION_SOLUTION | RESPIRATORY_TRACT | Status: DC

## 2016-03-12 MED ORDER — HYDROXYZINE HCL 25 MG PO TABS: 50.0000 mg | ORAL_TABLET | Freq: Three times a day (TID) | ORAL | Status: DC | PRN

## 2016-03-12 MED ORDER — VANCOMYCIN HCL IN DEXTROSE 750-5 MG/150ML-% IV SOLN
INTRAVENOUS | Status: AC
  Filled 2016-03-12: qty 150

## 2016-03-12 MED ORDER — LEVOFLOXACIN IN D5W 750 MG/150ML IV SOLN
750.0000 mg | Freq: Once | INTRAVENOUS | Status: AC
Start: 2016-03-12 — End: 2016-03-12
  Administered 2016-03-12: 750 mg via INTRAVENOUS
  Filled 2016-03-12: qty 150

## 2016-03-12 MED ORDER — DEXTROSE 5 % IV SOLN
INTRAVENOUS | Status: AC
  Filled 2016-03-12 (×2): qty 1

## 2016-03-12 MED ORDER — BENZONATATE 100 MG PO CAPS
200.0000 mg | ORAL_CAPSULE | Freq: Three times a day (TID) | ORAL | Status: DC | PRN
  Administered 2016-03-12 – 2016-03-13 (×2): 200 mg via ORAL
  Filled 2016-03-12 (×3): qty 2

## 2016-03-12 MED ORDER — IOPAMIDOL (ISOVUE-370) INJECTION 76%
100.0000 mL | Freq: Once | INTRAVENOUS | Status: AC | PRN
  Administered 2016-03-12: 100 mL via INTRAVENOUS

## 2016-03-12 MED ORDER — NICOTINE 21 MG/24HR TD PT24
21.0000 mg | MEDICATED_PATCH | Freq: Every day | TRANSDERMAL | Status: DC
  Filled 2016-03-12 (×3): qty 1

## 2016-03-12 MED ORDER — GUAIFENESIN-DM 100-10 MG/5ML PO SYRP
5.0000 mL | ORAL_SOLUTION | Freq: Four times a day (QID) | ORAL | Status: DC | PRN
  Administered 2016-03-13: 5 mL via ORAL
  Filled 2016-03-12 (×2): qty 5

## 2016-03-12 NOTE — H&P (Signed)
History and Physical    Chad Booth KZS:[id number] DOB: 10/09/1970 DOA: 03/12/2016  PCP: No PCP Per Patient Consultants:  None Patient coming from: home - lives alone; NOK: Hollace Hayward, director or [facility] (rehab), [phone]  Chief Complaint: cough  HPI: Chad Booth is a 46 y.o. male with medical history significant of alcohol dependence in remission for 2 years presenting with recurrent pneumonia.  Patient started getting sick at Christmas party, 12/5.  Initially seen on 12/27, given Amoxil and Azithromycin for RLL PNA.  Completed antibiotics but still with ongoing cough and SOB.  Feels like he is suffocating sometimes.  Seen again on 1/10 after feeling a little better and then developing recurrent worsening of symptoms.  CXR showed possible new PNA in left perihilar region and gave a different antibiotics (doxycycline).  He did get some better but unable to walk more than a mile a day when he previoulsy walked 4 miles a day.  Progressively worsened and now gets SOB just going to bathroom.  Staccato cough.  Light-headed, fainted a couple of times yesterday.  Now with just dry cough, like a tickle in the back of his throat.  No fevers.  No sick contacts, no known flu exposure.  Has taken about a month off work because of the illness.  Breathing treatment in the ER helped some, has not been on these at home.  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  Sober for 2 years.  Drug of choice was vodka.  No h/o IVDA.  Denies significant risk for HIV, frequent tests, last test 12/05/15.   Prison 20 years ago, minimum security.  Last TB test was 2 years ago.  Has not been tested for pertussis.  Last tetanus was maybe 4 years ago.     ED Course: Per Dr. Clarene Duke: 1635: Pt's 3rd ED visit in the past 2 months for same complaint. Endorses compliance with 2 courses of abx. Short neb given for persistent cough. Pt ambulated in hallway: pt's O2 Sats dropped to 84 % R/A with pt c/o "dizziness,"  and increasing SOB, with increasing HR and RR. Pt escorted back to stretcher with O2 Sats slowly increasing to 95 % R/A. 2nd lactic acid mildly elevated; IVF bolus given. Will dose IV steroid, IV abx, admit. T/C to [facility]. Ophelia Charter, case discussed, including: HPI, pertinent PM/SHx, VS/PE, dx testing, ED course and treatment: Agreeable to admit, requests to write temporary orders, obtain medical bed to team APAdmits.   Review of Systems: As per HPI; otherwise 10 point review of systems reviewed and negative.   Ambulatory Status:   ambulates without assistance, currently dizzy with ambulation  Past Medical History:  Diagnosis Date  . Anxiety   . Depression     History reviewed. No pertinent surgical history.  Social History   Social History  . Marital status: Single    Spouse name: N/A  . Number of children: N/A  . Years of education: N/A   Occupational History  . [employer] teacher    Social History Main Topics  . Smoking status: Current Every Day Smoker    Packs/day: 1.00    Years: 30.00    Types: Cigarettes  . Smokeless tobacco: Never Used     Comment: declines patch  . Alcohol use No     Comment: quit 2015  . Drug use: No  . Sexual activity: Not on file   Other Topics Concern  . Not on file   Social History Narrative  .  No narrative on file    No Known Allergies  Family History  Problem Relation Age of Onset  . CAD Father 80    Prior to Admission medications   Medication Sig Start Date End Date Taking? Authorizing Provider  busPIRone (BUSPAR) 10 MG tablet Take 10 mg by mouth 3 (three) times daily. 01/06/16  Yes Historical Provider, MD  gabapentin (NEURONTIN) 400 MG capsule Take 800 mg by mouth 4 (four) times daily. 12/18/15  Yes Historical Provider, MD  hydrOXYzine (ATARAX/VISTARIL) 50 MG tablet Take 50 mg by mouth 3 (three) times daily as needed for anxiety.  12/18/15  Yes Historical Provider, MD  lamoTRIgine (LAMICTAL) 200 MG tablet Take 400 mg by mouth at  bedtime. 12/18/15  Yes Historical Provider, MD  PROZAC 20 MG capsule Take 80 mg by mouth daily.  01/06/16  Yes Historical Provider, MD  QUEtiapine (SEROQUEL XR) 300 MG 24 hr tablet Take 900 mg by mouth at bedtime.   Yes Historical Provider, MD  amoxicillin (AMOXIL) 500 MG capsule Take 1 capsule (500 mg total) by mouth 3 (three) times daily. Patient not taking: Reported on 03/12/2016 02/06/16   Raeford Razor, MD  azithromycin (ZITHROMAX) 250 MG tablet Take 1 tablet (250 mg total) by mouth daily. Take first 2 tablets together, then 1 every day until finished. Patient not taking: Reported on 03/12/2016 02/06/16   Raeford Razor, MD  doxycycline (VIBRAMYCIN) 100 MG capsule Take 1 capsule (100 mg total) by mouth 2 (two) times daily. Patient not taking: Reported on 03/12/2016 02/20/16   Benjiman Core, MD    Physical Exam: Vitals:   03/12/16 1704 03/12/16 1727 03/12/16 2106 03/12/16 2300  BP:  (!) 103/48 104/70   Pulse: 79 86 77   Resp: (!) 31 (!) 25 (!) 24   Temp:  98 F (36.7 C) 98 F (36.7 C)   TempSrc:  Oral Oral   SpO2: 93% 91% 91% (!) 89%  Weight:  78.2 kg (172 lb 4.8 oz)    Height:  5\' 11"  (1.803 m)       General:  Appears markedly uncomfortable, has persistent staccato cough and appears to be quite SOB with conversation, recurrent cough Eyes: EOMI, normal lids, iris ENT:  grossly normal hearing, lips & tongue, mmm Neck:  no LAD, masses or thyromegaly Cardiovascular:  RRR, no m/r/g. No LE edema.  Respiratory: Coarse breath sounds diffusely, no w/r/r. Increased WOB, frequent nonproductive cough Abdomen:  soft, ntnd, NABS Skin:  no rash or induration seen on limited exam Musculoskeletal:  grossly normal tone BUE/BLE, good ROM, no bony abnormality Psychiatric:  grossly normal mood and affect, speech fluent and appropriate, AOx3 Neurologic:  CN 2-12 grossly intact, moves all extremities in coordinated fashion, sensation intact  Labs on Admission: I have personally reviewed following  labs and imaging studies  CBC:  Recent Labs Lab 03/12/16 1257  WBC 9.6  NEUTROABS 8.1*  HGB 10.2*  HCT 30.7*  MCV 82.7  PLT 218   Basic Metabolic Panel:  Recent Labs Lab 03/12/16 1257  NA 134*  K 3.8  CL 102  CO2 24  GLUCOSE 94  BUN 13  CREATININE 1.13  CALCIUM 9.1   GFR: Estimated Creatinine Clearance: 86.1 mL/min (by C-G formula based on SCr of 1.13 mg/dL). Liver Function Tests: No results for input(s): AST, ALT, ALKPHOS, BILITOT, PROT, ALBUMIN in the last 168 hours. No results for input(s): LIPASE, AMYLASE in the last 168 hours. No results for input(s): AMMONIA in the last 168 hours. Coagulation  Profile: No results for input(s): INR, PROTIME in the last 168 hours. Cardiac Enzymes:  Recent Labs Lab 03/12/16 1257  TROPONINI <0.03   BNP (last 3 results) No results for input(s): PROBNP in the last 8760 hours. HbA1C: No results for input(s): HGBA1C in the last 72 hours. CBG:  Recent Labs Lab 03/12/16 1232  GLUCAP 97   Lipid Profile: No results for input(s): CHOL, HDL, LDLCALC, TRIG, CHOLHDL, LDLDIRECT in the last 72 hours. Thyroid Function Tests: No results for input(s): TSH, T4TOTAL, FREET4, T3FREE, THYROIDAB in the last 72 hours. Anemia Panel: No results for input(s): VITAMINB12, FOLATE, FERRITIN, TIBC, IRON, RETICCTPCT in the last 72 hours. Urine analysis: No results found for: COLORURINE, APPEARANCEUR, LABSPEC, PHURINE, GLUCOSEU, HGBUR, BILIRUBINUR, KETONESUR, PROTEINUR, UROBILINOGEN, NITRITE, LEUKOCYTESUR  Creatinine Clearance: Estimated Creatinine Clearance: 86.1 mL/min (by C-G formula based on SCr of 1.13 mg/dL).  Sepsis Labs: @LABRCNTIP (procalcitonin:4,lacticidven:4) ) Recent Results (from the past 240 hour(s))  Culture, blood (routine x 2) Call MD if unable to obtain prior to antibiotics being given     Status: None (Preliminary result)   Collection Time: 03/12/16  7:45 PM  Result Value Ref Range Status   Specimen Description RIGHT  ANTECUBITAL  Final   Special Requests BOTTLES DRAWN AEROBIC AND ANAEROBIC 6CC  Final   Culture PENDING  Incomplete   Report Status PENDING  Incomplete  Culture, blood (routine x 2) Call MD if unable to obtain prior to antibiotics being given     Status: None (Preliminary result)   Collection Time: 03/12/16  7:58 PM  Result Value Ref Range Status   Specimen Description BLOOD LEFT HAND  Final   Special Requests BOTTLES DRAWN AEROBIC AND ANAEROBIC [facility]  Final   Culture PENDING  Incomplete   Report Status PENDING  Incomplete     Radiological Exams on Admission: Ct Angio Chest Pe W/cm &/or Wo Cm  Result Date: 03/12/2016 CLINICAL DATA:  Cough, shortness breath and weakness over the last 2 months. EXAM: CT ANGIOGRAPHY CHEST WITH CONTRAST TECHNIQUE: Multidetector CT imaging of the chest was performed using the standard protocol during bolus administration of intravenous contrast. Multiplanar CT image reconstructions and MIPs were obtained to evaluate the vascular anatomy. CONTRAST:  100 cc Isovue 370 COMPARISON:  Radiography 02/20/2016 FINDINGS: Cardiovascular: Pulmonary arterial opacification is excellent. There are no pulmonary emboli. No aortic pathology. Extensive coronary artery calcification, premature for age. Mediastinum/Nodes: No mass or lymphadenopathy. Lungs/Pleura: Hazy bilateral bronchopneumonia extensively throughout both lungs, lower lobe predominant. Whereas infectious pneumonia is favored, inhalational or allergic lung disease is possible. No pleural fluid. Upper Abdomen: Normal Musculoskeletal: Ordinary degenerative changes affect the spine. Old minor superior endplate compression fracture at T4. Review of the MIP images confirms the above findings. IMPRESSION: No pulmonary emboli. Patchy hazy airspace density within both lungs, lower lobe predominant. Bronchopneumonia statistically most likely. However, the differential diagnosis does include inhalational and allergic lung disease.  Premature coronary artery calcification. Electronically Signed   By: Paulina Fusi M.D.   On: 03/12/2016 15:16    EKG: Independently reviewed.  NSR with rate 86;  no evidence of acute ischemia  Assessment/Plan Principal Problem:   Recurrent pneumonia Active Problems:   Anemia   CAD (coronary artery disease)   Substance abuse   Tobacco use disorder   Recurrent PNA -Patient now with his 3rd recent episode of CXR-documented PNA, in various locations -This may be drug-resistant infection -Previously treated with Amoxil, Azithromycin, and Doxycycline.  Was given Levaquin in the ER tonight. -Will treat for  now with Cefepime and Vanc -CXR also mentions differential diagnosis to include inhalational cause and allergic.  He has no eosinophils on CBC and so allergic seems unlikely.  He has a h/o substance abuse and inhalational lung disease remains on the differential (particularly in light of UDS positive for opiates - see below). -Flu negative -Additional potential rare causes include pertussis (will check respiratory virus panel); legionella. -Will check for Strep pneumo. -Will add HIV and TB testing given h/o substance abuse, rehab, and remote imprisonment. -Lactate 0.9, 2.1 -Will also give standing Duonebs and prn Albuterol -Will given Solumedrol 125 mg x 1 in ER; consider a trial of steroids, as well, if current treatments are not effective -Additionally, there could be underlying COPD and/or malignancy given his long-standing smoking history; if not improving with current treatments, would suggest pulmonary consult for possible bronchoscopy -He does not have apparent hypoxia at rest but will order supplemental O2 for comfort  Anemia -Hgb 10.2 -Normocytic -Will trend  Substance abuse -Patient reports remote h/o ETOH dependence with sobriety x 30 months -He denies current use of alcohol or drugs -UDS positive for opiates -Review of his chart from today's encounter from the ER to  present does not show an order for opiates -Review of the [facility] does not show a prescription for opiates within the last 6 months -This raises the question of illicit use of opiate medications, which could include inhalational use -This was not addressed with the patient at the time of admission because UDS was not obtained in the ER -Would suggest avoidance of habit-forming medication during hospitalization, if possible -Additionally, patient apparently has a multitude of psychiatric illnesses based on his medications (BuSpar, Hydroxyzine, Lamictal, Prozac); this was not mentioned at all by the patient at the time of the admission and he did not report seeing any physician who is prescribing these medications.  Ongoing use of substances could also negatively impact his mental health.  Possible CAD -Premature CAD noted incidentally on CT -Patient's father died of CAD  -Suggest outpatient PCP f/u -This was reviewed the with patient   Tobacco dependence -Encourage cessation.  This was discussed with the patient and should be reviewed on an ongoing basis.   -Patch ordered.   DVT prophylaxis: Lovenox Code Status: Full - confirmed with patient Family Communication: None present Disposition Plan:  Home once clinically improved Consults called: None  Admission status: It is my clinical opinion that referral for OBSERVATION is reasonable and necessary in this patient based on the above information provided. The aforementioned taken together are felt to place the patient at high risk for further clinical deterioration. However it is anticipated that the patient may be medically stable for discharge from the hospital within 24 to 48 hours.     Jonah BlueJennifer Angelo Caroll MD [facility]  If 7PM-7AM, please contact night-coverage [email or web address] Password [email or web address]  03/12/2016, 11:15 PM

## 2016-03-12 NOTE — Progress Notes (Signed)
Pharmacy Antibiotic Note   Chad Booth is a 46 y.o. male admitted on 03/12/2016 with pneumonia.  Pharmacy has been consulted for Vancomycin dosing.  Plan: Vancomycin 1gm IV x 1 Vancomycin 750 mg IV every 8 hours.  Goal trough 15-20 mcg/mL.  Monitor labs, micro and vitals.   Height: 5\' 11"  (180.3 cm) Weight: 172 lb 4.8 oz (78.2 kg) IBW/kg (Calculated) : 75.3  Temp (24hrs), Avg:97.8 F (36.6 C), Min:97.6 F (36.4 C), Max:98 F (36.7 C)   Recent Labs Lab 03/12/16 1257 03/12/16 1306 03/12/16 1509  WBC 9.6  --   --   CREATININE 1.13  --   --   LATICACIDVEN  --  0.9 2.1*    Estimated Creatinine Clearance: 86.1 mL/min (by C-G formula based on SCr of 1.13 mg/dL).    No Known Allergies  Antimicrobials this admission: Vanc 1/31 >>  Cefepime 1/31 >>   Dose adjustments this admission: n/a   Microbiology results: 1/31 BCx: pending 1/31 HIV: pending  1/31 Sputum: pending  1/31 Strep:  pending  Thank you for allowing pharmacy to be a part of this patient's care.  Chad Booth, Chad Booth 03/12/2016 7:54 PM

## 2016-03-12 NOTE — ED Notes (Signed)
EKG given to Dr. Mesner. 

## 2016-03-12 NOTE — Progress Notes (Signed)
Adv pt he was ordered Ibuprofen for pain. 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  Pt told RN that he wanted me to page doctor for something to help him pass gas d/t bloating. MD paged, waiting for call back or order

## 2016-03-12 NOTE — ED Notes (Signed)
Pt had coughing spell prior to ambulation and sats on RA down to 90 %,  Pt ambulated around nurses desk and pt did desat to as low as 84 % on RA and c/o mild dizziness while ambulating, pt was able to ambulate back to room without difficultly

## 2016-03-12 NOTE — ED Notes (Signed)
CRITICAL VALUE ALERT  Critical value received:  Lactic acid 2.1  Date of notification:  03/12/16  Time of notification:  1633  Critical value read back:Yes.    Nurse who received alert:  B. Garner Nashaniels, RN  MD notified (1st page):  Clarene DukeMcManus  Time of first page:  1633  MD notified (2nd page):  Time of second page:  Responding MD:  Clarene DukeMcManus  Time MD responded:  [phone]

## 2016-03-12 NOTE — Progress Notes (Signed)
Pt c/o abdominal pain and bloating. MD on call paged (dr. Craige CottaKirby) waiting for call back. Pt aware

## 2016-03-12 NOTE — ED Provider Notes (Signed)
[facility]Y DEPT Provider Note   CSN: [id number] Arrival date & time: 03/12/16  1222     History   Chief Complaint Chief Complaint  Patient presents with  . Cough    HPI Chad Booth is a 46 y.o. male.  HPI  Pt was seen at 1245.  Per pt, c/o gradual onset and persistence of constant cough and SOB for the past 2 months. Has been associated with generalized weakness/fatigue. Pt states he has been evaluated in the ED x2 for same, dx pneumonia, rx antibiotics. Pt did not f/u as outpatient. Pt states he stopped smoking cigarettes for a short period of time "and got better but then I started smoking again and got worse again."  Denies fevers, no rash, no CP/palpitations, no N/V/D, no abd pain.      History reviewed. No pertinent past medical history.  There are no active problems to display for this patient.   History reviewed. No pertinent surgical history.     Home Medications    Prior to Admission medications   Medication Sig Start Date End Date Taking? Authorizing Provider  busPIRone (BUSPAR) 10 MG tablet Take 10 mg by mouth 3 (three) times daily. 01/06/16  Yes Historical Provider, MD  gabapentin (NEURONTIN) 400 MG capsule Take 800 mg by mouth 4 (four) times daily. 12/18/15  Yes Historical Provider, MD  hydrOXYzine (ATARAX/VISTARIL) 50 MG tablet Take 50 mg by mouth 3 (three) times daily as needed for anxiety.  12/18/15  Yes Historical Provider, MD  lamoTRIgine (LAMICTAL) 200 MG tablet Take 400 mg by mouth at bedtime. 12/18/15  Yes Historical Provider, MD  PROZAC 20 MG capsule Take 80 mg by mouth daily.  01/06/16  Yes Historical Provider, MD  QUEtiapine (SEROQUEL XR) 300 MG 24 hr tablet Take 900 mg by mouth at bedtime.   Yes Historical Provider, MD  amoxicillin (AMOXIL) 500 MG capsule Take 1 capsule (500 mg total) by mouth 3 (three) times daily. Patient not taking: Reported on 03/12/2016 02/06/16   Raeford Razor, MD  azithromycin (ZITHROMAX) 250 MG tablet Take 1 tablet  (250 mg total) by mouth daily. Take first 2 tablets together, then 1 every day until finished. Patient not taking: Reported on 03/12/2016 02/06/16   Raeford Razor, MD  doxycycline (VIBRAMYCIN) 100 MG capsule Take 1 capsule (100 mg total) by mouth 2 (two) times daily. Patient not taking: Reported on 03/12/2016 02/20/16   Benjiman Core, MD    Family History History reviewed. No pertinent family history.  Social History Social History  Substance Use Topics  . Smoking status: Former Smoker    Types: Cigarettes    Quit date: 01/23/2016  . Smokeless tobacco: Never Used  . Alcohol use No     Allergies   Patient has no known allergies.   Review of Systems Review of Systems ROS: Statement: All systems negative except as marked or noted in the HPI; Constitutional: Negative for fever and chills. +generalized weakness/fatigue. ; ; Eyes: Negative for eye pain, redness and discharge. ; ; ENMT: Negative for ear pain, hoarseness, nasal congestion, sinus pressure and sore throat. ; ; Cardiovascular: Negative for chest pain, palpitations, diaphoresis, and peripheral edema. ; ; Respiratory: +cough, SOB. Negative for wheezing and stridor. ; ; Gastrointestinal: Negative for nausea, vomiting, diarrhea, abdominal pain, blood in stool, hematemesis, jaundice and rectal bleeding. . ; ; Genitourinary: Negative for dysuria, flank pain and hematuria. ; ; Musculoskeletal: Negative for back pain and neck pain. Negative for swelling and trauma.; ; Skin: Negative  for pruritus, rash, abrasions, blisters, bruising and skin lesion.; ; Neuro: Negative for headache, lightheadedness and neck stiffness. Negative for altered level of consciousness, altered mental status, extremity weakness, paresthesias, involuntary movement, seizure and syncope.       Physical Exam Updated Vital Signs BP 130/78 (BP Location: Left Arm)   Pulse 82   Temp 97.6 F (36.4 C) (Oral)   Resp 22   Ht 5\' 11"  (1.803 m)   Wt 165 lb (74.8 kg)    SpO2 94%   BMI 23.01 kg/m   14:05 Orthostatic Vital Signs DF  Orthostatic Lying   BP- Lying: 105/58  Pulse- Lying: 84      Orthostatic Sitting  BP- Sitting: 111/62  Pulse- Sitting: 88      Orthostatic Standing at 0 minutes  BP- Standing at 0 minutes: 102/57  Pulse- Standing at 0 minutes: 84     Physical Exam 1250: Physical examination:  Nursing notes reviewed; Vital signs and O2 SAT reviewed;  Constitutional: Well developed, Well nourished, Well hydrated, In no acute distress; Head:  Normocephalic, atraumatic; Eyes: EOMI, PERRL, No scleral icterus; ENMT: TM's clear bilat. +edemetous nasal turbinates bilat with clear rhinorrhea. Mouth and pharynx without lesions. No tonsillar exudates. No intra-oral edema. No submandibular or sublingual edema. No hoarse voice, no drooling, no stridor. No pain with manipulation of larynx. No trismus. Mouth and pharynx normal, Mucous membranes moist; Neck: Supple, Full range of motion, No lymphadenopathy; Cardiovascular: Regular rate and rhythm, No gallop; Respiratory: Breath sounds clear & equal bilaterally, No wheezes.  Speaking full sentences with ease, Normal respiratory effort/excursion; Chest: Nontender, Movement normal; Abdomen: Soft, Nontender, Nondistended, Normal bowel sounds; Genitourinary: No CVA tenderness; Extremities: Pulses normal, No tenderness, No edema, No calf edema or asymmetry.; Neuro: AA&Ox3, Major CN grossly intact.  Speech clear. No gross focal motor or sensory deficits in extremities.; Skin: Color normal, Warm, Dry.   ED Treatments / Results  Labs (all labs ordered are listed, but only abnormal results are displayed)   EKG  EKG Interpretation  Date/Time:  Wednesday March 12 2016 12:36:26 EST Ventricular Rate:  86 PR Interval:    QRS Duration: 96 QT Interval:  392 QTC Calculation: 469 R Axis:   50 Text Interpretation:  Sinus rhythm No old tracing to compare Confirmed by [facility]  MD, Nicholos Johns [phone]) on 03/12/2016  1:12:19 PM       Radiology   Procedures Procedures (including critical care time)  Medications Ordered in ED Medications  ipratropium-albuterol (DUONEB) 0.5-2.5 (3) MG/3ML nebulizer solution 3 mL (not administered)  albuterol (PROVENTIL) (2.5 MG/3ML) 0.083% nebulizer solution 2.5 mg (not administered)     Initial Impression / Assessment and Plan / ED Course  I have reviewed the triage vital signs and the nursing notes.  Pertinent labs & imaging results that were available during my care of the patient were reviewed by me and considered in my medical decision making (see chart for details).  MDM Reviewed: previous chart, nursing note and vitals Reviewed previous: x-ray Interpretation: labs, ECG and CT scan   Results for orders placed or performed during the hospital encounter of 03/12/16  Basic metabolic panel  Result Value Ref Range   Sodium 134 (L) 135 - 145 mmol/L   Potassium 3.8 3.5 - 5.1 mmol/L   Chloride 102 101 - 111 mmol/L   CO2 24 22 - 32 mmol/L   Glucose, Bld 94 65 - 99 mg/dL   BUN 13 6 - 20 mg/dL   Creatinine, Ser 6.04 0.61 -  1.24 mg/dL   Calcium 9.1 8.9 - 45.4 mg/dL   GFR calc non Af Amer >60 >60 mL/min   GFR calc Af Amer >60 >60 mL/min   Anion gap 8 5 - 15  Lactic acid, plasma  Result Value Ref Range   Lactic Acid, Venous 0.9 0.5 - 1.9 mmol/L  Lactic acid, plasma  Result Value Ref Range   Lactic Acid, Venous 2.1 (HH) 0.5 - 1.9 mmol/L  Troponin I  Result Value Ref Range   Troponin I <0.03 <0.03 ng/mL  CBC with Differential  Result Value Ref Range   WBC 9.6 4.0 - 10.5 K/uL   RBC 3.71 (L) 4.22 - 5.81 MIL/uL   Hemoglobin 10.2 (L) 13.0 - 17.0 g/dL   HCT 09.8 (L) 11.9 - 14.7 %   MCV 82.7 78.0 - 100.0 fL   MCH 27.5 26.0 - 34.0 pg   MCHC 33.2 30.0 - 36.0 g/dL   RDW 82.9 (H) 56.2 - 13.0 %   Platelets 218 150 - 400 K/uL   Neutrophils Relative % 84 %   Neutro Abs 8.1 (H) 1.7 - 7.7 K/uL   Lymphocytes Relative 11 %   Lymphs Abs 1.0 0.7 - 4.0 K/uL    Monocytes Relative 5 %   Monocytes Absolute 0.5 0.1 - 1.0 K/uL   Eosinophils Relative 0 %   Eosinophils Absolute 0.0 0.0 - 0.7 K/uL   Basophils Relative 0 %   Basophils Absolute 0.0 0.0 - 0.1 K/uL  POC CBG, ED  Result Value Ref Range   Glucose-Capillary 97 65 - 99 mg/dL   Ct Angio Chest Pe W/cm &/or Wo Cm Result Date: 03/12/2016 CLINICAL DATA:  Cough, shortness breath and weakness over the last 2 months. EXAM: CT ANGIOGRAPHY CHEST WITH CONTRAST TECHNIQUE: Multidetector CT imaging of the chest was performed using the standard protocol during bolus administration of intravenous contrast. Multiplanar CT image reconstructions and MIPs were obtained to evaluate the vascular anatomy. CONTRAST:  100 cc Isovue 370 COMPARISON:  Radiography 02/20/2016 FINDINGS: Cardiovascular: Pulmonary arterial opacification is excellent. There are no pulmonary emboli. No aortic pathology. Extensive coronary artery calcification, premature for age. Mediastinum/Nodes: No mass or lymphadenopathy. Lungs/Pleura: Hazy bilateral bronchopneumonia extensively throughout both lungs, lower lobe predominant. Whereas infectious pneumonia is favored, inhalational or allergic lung disease is possible. No pleural fluid. Upper Abdomen: Normal Musculoskeletal: Ordinary degenerative changes affect the spine. Old minor superior endplate compression fracture at T4. Review of the MIP images confirms the above findings. IMPRESSION: No pulmonary emboli. Patchy hazy airspace density within both lungs, lower lobe predominant. Bronchopneumonia statistically most likely. However, the differential diagnosis does include inhalational and allergic lung disease. Premature coronary artery calcification. Electronically Signed   By: Paulina Fusi M.D.   On: 03/12/2016 15:16    1635:  Pt's 3rd ED visit in the past 2 months for same complaint.  Endorses compliance with 2 courses of abx. Short neb given for persistent cough. Pt ambulated in hallway: pt's O2 Sats  dropped to 84 % R/A with pt c/o "dizziness," and increasing SOB, with increasing HR and RR. Pt escorted back to stretcher with O2 Sats slowly increasing to 95 % R/A. 2nd lactic acid mildly elevated; IVF bolus given. Will dose IV steroid, IV abx, admit. T/C to [facility]. Ophelia Charter, case discussed, including:  HPI, pertinent PM/SHx, VS/PE, dx testing, ED course and treatment:  Agreeable to admit, requests to write temporary orders, obtain medical bed to team [facility].     Final Clinical Impressions(s) / ED  Diagnoses   Final diagnoses:  None    New Prescriptions New Prescriptions   No medications on file     Samuel JesterKathleen Rockwell Zentz, DO 03/15/16 1344

## 2016-03-12 NOTE — ED Triage Notes (Signed)
Seen here several times for same symptoms, cough, weakness, dizziness.  Treated with antibiotic x2.  Still not feeling any better.

## 2016-03-13 DIAGNOSIS — J189 Pneumonia, unspecified organism: Secondary | ICD-10-CM | POA: Diagnosis present

## 2016-03-13 LAB — CBC WITH DIFFERENTIAL/PLATELET
Basophils Absolute: 0 10*3/uL (ref 0.0–0.1)
Basophils Relative: 0 %
Eosinophils Absolute: 0 10*3/uL (ref 0.0–0.7)
Eosinophils Relative: 0 %
HEMATOCRIT: 28.3 % — AB (ref 39.0–52.0)
HEMOGLOBIN: 9.4 g/dL — AB (ref 13.0–17.0)
LYMPHS ABS: 0.8 10*3/uL (ref 0.7–4.0)
Lymphocytes Relative: 5 %
MCH: 27.8 pg (ref 26.0–34.0)
MCHC: 33.2 g/dL (ref 30.0–36.0)
MCV: 83.7 fL (ref 78.0–100.0)
MONOS PCT: 3 %
Monocytes Absolute: 0.5 10*3/uL (ref 0.1–1.0)
NEUTROS ABS: 14.3 10*3/uL — AB (ref 1.7–7.7)
NEUTROS PCT: 92 %
Platelets: 241 10*3/uL (ref 150–400)
RBC: 3.38 MIL/uL — AB (ref 4.22–5.81)
RDW: 16.9 % — ABNORMAL HIGH (ref 11.5–15.5)
WBC: 15.7 10*3/uL — AB (ref 4.0–10.5)

## 2016-03-13 LAB — BASIC METABOLIC PANEL
ANION GAP: 8 (ref 5–15)
BUN: 14 mg/dL (ref 6–20)
CHLORIDE: 105 mmol/L (ref 101–111)
CO2: 23 mmol/L (ref 22–32)
CREATININE: 0.91 mg/dL (ref 0.61–1.24)
Calcium: 8.6 mg/dL — ABNORMAL LOW (ref 8.9–10.3)
GFR calc non Af Amer: >60 mL/min (ref >60)
Glucose, Bld: 129 mg/dL — ABNORMAL HIGH (ref 65–99)
POTASSIUM: 4.6 mmol/L (ref 3.5–5.1)
SODIUM: 136 mmol/L (ref 135–145)

## 2016-03-13 LAB — STREP PNEUMONIAE URINARY ANTIGEN: Strep Pneumo Urinary Antigen: NEGATIVE

## 2016-03-13 MED ORDER — ENSURE ENLIVE PO LIQD
237.0000 mL | Freq: Two times a day (BID) | ORAL | Status: DC
  Administered 2016-03-13 – 2016-03-14 (×2): 237 mL via ORAL

## 2016-03-13 MED ORDER — PREDNISONE 20 MG PO TABS
40.0000 mg | ORAL_TABLET | Freq: Every day | ORAL | Status: DC
  Administered 2016-03-13 – 2016-03-15 (×3): 40 mg via ORAL
  Filled 2016-03-13 (×3): qty 2

## 2016-03-13 NOTE — Progress Notes (Signed)
Nutrition Brief Note  Patient identified on the Malnutrition Screening Tool (MST) Report  Wt Readings from Last 15 Encounters:  03/12/16 172 lb 4.8 oz (78.2 kg)  02/20/16 165 lb (74.8 kg)  02/06/16 155 lb (70.3 kg)  03/01/14 175 lb (79.4 kg)   Body mass index is 24.03 kg/m. Patient meets criteria for healthy wt for ht based on current BMI.   Pt says that for the last few weeks he has been eating mostly fruit and oatmeal. He attributes this to his PNA. He says his appetite is fair. He admits he is fairly picky, but is finding foods he likes. He ate most of lunch today.   Denies N/V/C/D   His weight is stable. He gives UBW of 175. He appears to have gained weight in the past few months.  Current diet order is Regular, patient is consuming approximately 85% of meals at this time.   He was agreeable to ordered Ensure around meal times incase he does not like the food or does not have an appetite  Christophe LouisNathan Hanadi Stanly RD, LDN, CNSC Clinical Nutrition Pager: [phone] 03/13/2016 3:20 PM

## 2016-03-13 NOTE — Progress Notes (Signed)
Chad RamusSteven J Cecena WUJ:[id number] DOB: Sep 18, 1970 DOA: 03/12/2016 PCP: No PCP Per Patient  Brief narrative:  46 y/o ? Known history of suicidality, borderline personality, bipolar and lithium overdose 10/2013,  prior ethanolism-sober 2 years Recently treated for pneumonia x 2 over Christmas and then subsequently 02/19/78  Came back to the emergency room 1/31 Found to have pulse ox in the 80s increasing heart rate respiratory rate lactic acid mildly elevated and recurrent pneumonia   Past medical history-As per Problem list Chart reviewed as below- Reviewed and [facility] chart reviewed  Consultants:   none  Procedures:   none  Antibiotics:  Vancomycin 1/31  Cefepime 1/31   Subjective   Sleepy this morning but no acute issues Feels 100% better than he did previously No chest pain No nausea Vomiting   Objective    Interim History:   Telemetry: None telemetry   Objective: Vitals:   03/12/16 1727 03/12/16 2106 03/12/16 2300 03/13/16 0657  BP: (!) 103/48 104/70  100/64  Pulse: 86 77  76  Resp: (!) 25 (!) 24  20  Temp: 98 F (36.7 C) 98 F (36.7 C)  97.5 F (36.4 C)  TempSrc: Oral Oral  Oral  SpO2: 91% 91% (!) 89% 91%  Weight: 78.2 kg (172 lb 4.8 oz)     Height: 5\' 11"  (1.803 m)       Intake/Output Summary (Last 24 hours) at 03/13/16 0756 Last data filed at 03/13/16 [id number]  Gross per 24 hour  Intake             2075 ml  Output              850 ml  Net             1225 ml    Exam:  General: Alert oriented and looking older than stated age Cardiovascular: S1-S2 no murmur rub or gallop Respiratory: Clinically clear no added sound no wheeze Abdomen: Soft nontender nondistended no rebound no guarding Skin no lower extremity edema Neuro intact checked.  Data Reviewed: Basic Metabolic Panel:  Recent Labs Lab 03/12/16 1257 03/13/16 0459  NA 134* 136  K 3.8 4.6  CL 102 105  CO2 24 23  GLUCOSE 94 129*  BUN 13 14  CREATININE 1.13 0.91  CALCIUM  9.1 8.6*   Liver Function Tests: No results for input(s): AST, ALT, ALKPHOS, BILITOT, PROT, ALBUMIN in the last 168 hours. No results for input(s): LIPASE, AMYLASE in the last 168 hours. No results for input(s): AMMONIA in the last 168 hours. CBC:  Recent Labs Lab 03/12/16 1257 03/13/16 0459  WBC 9.6 15.7*  NEUTROABS 8.1* 14.3*  HGB 10.2* 9.4*  HCT 30.7* 28.3*  MCV 82.7 83.7  PLT 218 241   Cardiac Enzymes:  Recent Labs Lab 03/12/16 1257  TROPONINI <0.03   BNP: Invalid input(s): POCBNP CBG:  Recent Labs Lab 03/12/16 1232  GLUCAP 97    Recent Results (from the past 240 hour(s))  Culture, blood (routine x 2) Call MD if unable to obtain prior to antibiotics being given     Status: None (Preliminary result)   Collection Time: 03/12/16  7:45 PM  Result Value Ref Range Status   Specimen Description RIGHT ANTECUBITAL  Final   Special Requests BOTTLES DRAWN AEROBIC AND ANAEROBIC 6CC  Final   Culture PENDING  Incomplete   Report Status PENDING  Incomplete  Culture, blood (routine x 2) Call MD if unable to obtain prior to antibiotics being given  Status: None (Preliminary result)   Collection Time: 03/12/16  7:58 PM  Result Value Ref Range Status   Specimen Description BLOOD LEFT HAND  Final   Special Requests BOTTLES DRAWN AEROBIC AND ANAEROBIC 6CC  Final   Culture PENDING  Incomplete   Report Status PENDING  Incomplete     Studies:              All Imaging reviewed and is as per above notation   Scheduled Meds: . busPIRone  10 mg Oral TID  . ceFEPime (MAXIPIME) IV  1 g Intravenous Q8H  . enoxaparin (LOVENOX) injection  40 mg Subcutaneous Q24H  . FLUoxetine  80 mg Oral Daily  . gabapentin  800 mg Oral QID  . ipratropium-albuterol  3 mL Nebulization Q6H  . lamoTRIgine  400 mg Oral QHS  . nicotine  21 mg Transdermal Daily  . QUEtiapine  900 mg Oral QHS  . vancomycin  750 mg Intravenous Q8H   Continuous Infusions: . lactated ringers 75 mL/hr at 03/12/16 2009      Assessment/Plan:  1.  pneumonia-continue vancomycin and cefepime, follow respiratory viral panel, follow strep pneumo and legionella 2. Acute hypoxic respiratory failure-O2 sats were in the 80s currently improved. Check a desaturation screen. 3. Presumed COPD-smoker. Continue DuoNeb and albuterol. Started on Solu-Medrol and 80, continue with prednisone 40 daily.  Will need outpatient follow. Continue nicotine patch. 4. Bipolar, suicidality-continue Seroquel XL 900, hydroxyzine 50 3 times a day when necessary, Prozac 80, BuSpar 10 3 times a day, lamictal 400 daily at bedtime-needs outpatient follow-up with psychiatrist   5. Neuropathy/chronic in continue Augmentin 800 4 times a day    Full code, No family present, Continue to monitor as inpatient Likely can discharge home in 2-3 days she  Pleas Koch, MD  [facility] Pager [phone] 03/13/2016, 7:56 AM    LOS: 0 days

## 2016-03-14 LAB — RESPIRATORY PANEL BY PCR
Adenovirus: NOT DETECTED
BORDETELLA PERTUSSIS-RVPCR: NOT DETECTED
Chlamydophila pneumoniae: NOT DETECTED
Coronavirus 229E: NOT DETECTED
Coronavirus HKU1: NOT DETECTED
Coronavirus NL63: NOT DETECTED
Coronavirus OC43: NOT DETECTED
INFLUENZA A-RVPPCR: NOT DETECTED
INFLUENZA B-RVPPCR: NOT DETECTED
Metapneumovirus: NOT DETECTED
Mycoplasma pneumoniae: NOT DETECTED
PARAINFLUENZA VIRUS 3-RVPPCR: NOT DETECTED
PARAINFLUENZA VIRUS 4-RVPPCR: NOT DETECTED
Parainfluenza Virus 1: NOT DETECTED
Parainfluenza Virus 2: NOT DETECTED
RESPIRATORY SYNCYTIAL VIRUS-RVPPCR: NOT DETECTED
RHINOVIRUS / ENTEROVIRUS - RVPPCR: NOT DETECTED

## 2016-03-14 LAB — BASIC METABOLIC PANEL
Anion gap: 6 (ref 5–15)
BUN: 16 mg/dL (ref 6–20)
CO2: 23 mmol/L (ref 22–32)
CREATININE: 0.79 mg/dL (ref 0.61–1.24)
Calcium: 8.2 mg/dL — ABNORMAL LOW (ref 8.9–10.3)
Chloride: 106 mmol/L (ref 101–111)
GFR calc Af Amer: >60 mL/min (ref >60)
GLUCOSE: 117 mg/dL — AB (ref 65–99)
Potassium: 3.9 mmol/L (ref 3.5–5.1)
SODIUM: 135 mmol/L (ref 135–145)

## 2016-03-14 LAB — LEGIONELLA PNEUMOPHILA SEROGP 1 UR AG: L. pneumophila Serogp 1 Ur Ag: NEGATIVE

## 2016-03-14 LAB — HIV ANTIBODY (ROUTINE TESTING W REFLEX): HIV Screen 4th Generation wRfx: NONREACTIVE

## 2016-03-14 MED ORDER — IPRATROPIUM-ALBUTEROL 0.5-2.5 (3) MG/3ML IN SOLN
3.0000 mL | Freq: Three times a day (TID) | RESPIRATORY_TRACT | Status: DC
  Administered 2016-03-14 – 2016-03-15 (×3): 3 mL via RESPIRATORY_TRACT
  Filled 2016-03-14 (×4): qty 3

## 2016-03-14 MED ORDER — ALUM & MAG HYDROXIDE-SIMETH 200-200-20 MG/5ML PO SUSP
15.0000 mL | Freq: Four times a day (QID) | ORAL | Status: DC | PRN
  Administered 2016-03-14: 15 mL via ORAL
  Filled 2016-03-14: qty 30

## 2016-03-14 NOTE — Progress Notes (Signed)
Chad Booth:[id number] DOB: Mar 29, 1970 DOA: 03/12/2016 PCP: No PCP Per Patient  Brief narrative:  46 y/o ? Known history of suicidality, borderline personality, bipolar and lithium overdose 10/2013,  prior ethanolism-sober 2 years Recently treated for pneumonia x 2 over Christmas and then subsequently 02/19/78  Came back to the emergency room 1/31 Found to have pulse ox in the 80s increasing heart rate respiratory rate lactic acid mildly elevated and recurrent pneumonia   Past medical history-As per Problem list Chart reviewed as below- Reviewed and [facility] chart reviewed  Consultants:   none  Procedures:   none  Antibiotics:  Vancomycin 1/31  Cefepime 1/31   Subjective   Feels good Asking about home No fever no chills  no cp No sputum  Walked 40 ft with me and desat to 84%   Objective    Interim History:   Telemetry: None telemetry   Objective: Vitals:   03/13/16 2241 03/14/16 0709 03/14/16 0827 03/14/16 1433  BP: (!) 99/52 108/64  (!) 95/55  Pulse: 82 78  87  Resp: 20 20  20   Temp: 98.6 F (37 C) 97.9 F (36.6 C)  98.6 F (37 C)  TempSrc: Oral Oral  Oral  SpO2: 98% 99% 97% 97%  Weight:      Height:        Intake/Output Summary (Last 24 hours) at 03/14/16 1549 Last data filed at 03/14/16 1500  Gross per 24 hour  Intake          3368.75 ml  Output             1900 ml  Net          1468.75 ml    Exam:  General: Alert oriented and looking older than stated age Cardiovascular: S1-S2 no murmur rub or gallop Respiratory: Clinically clear no added sound no wheeze Abdomen: Soft nontender nondistended no rebound no guarding Skin no lower extremity edema Neuro intact checked.  Data Reviewed: Basic Metabolic Panel:  Recent Labs Lab 03/12/16 1257 03/13/16 0459 03/14/16 0504  NA 134* 136 135  K 3.8 4.6 3.9  CL 102 105 106  CO2 24 23 23   GLUCOSE 94 129* 117*  BUN 13 14 16   CREATININE 1.13 0.91 0.79  CALCIUM 9.1 8.6* 8.2*    Liver Function Tests: No results for input(s): AST, ALT, ALKPHOS, BILITOT, PROT, ALBUMIN in the last 168 hours. No results for input(s): LIPASE, AMYLASE in the last 168 hours. No results for input(s): AMMONIA in the last 168 hours. CBC:  Recent Labs Lab 03/12/16 1257 03/13/16 0459  WBC 9.6 15.7*  NEUTROABS 8.1* 14.3*  HGB 10.2* 9.4*  HCT 30.7* 28.3*  MCV 82.7 83.7  PLT 218 241   Cardiac Enzymes:  Recent Labs Lab 03/12/16 1257  TROPONINI <0.03   BNP: Invalid input(s): POCBNP CBG:  Recent Labs Lab 03/12/16 1232  GLUCAP 97    Recent Results (from the past 240 hour(s))  Culture, blood (routine x 2) Call MD if unable to obtain prior to antibiotics being given     Status: None (Preliminary result)   Collection Time: 03/12/16  7:45 PM  Result Value Ref Range Status   Specimen Description RIGHT ANTECUBITAL  Final   Special Requests BOTTLES DRAWN AEROBIC AND ANAEROBIC 6CC  Final   Culture NO GROWTH 2 DAYS  Final   Report Status PENDING  Incomplete  Culture, blood (routine x 2) Call MD if unable to obtain prior to antibiotics being given  Status: None (Preliminary result)   Collection Time: 03/12/16  7:58 PM  Result Value Ref Range Status   Specimen Description BLOOD LEFT HAND  Final   Special Requests BOTTLES DRAWN AEROBIC AND ANAEROBIC 6CC  Final   Culture NO GROWTH 2 DAYS  Final   Report Status PENDING  Incomplete     Studies:              All Imaging reviewed and is as per above notation   Scheduled Meds: . busPIRone  10 mg Oral TID  . ceFEPime (MAXIPIME) IV  1 g Intravenous Q8H  . enoxaparin (LOVENOX) injection  40 mg Subcutaneous Q24H  . feeding supplement (ENSURE ENLIVE)  237 mL Oral BID WC  . FLUoxetine  80 mg Oral Daily  . gabapentin  800 mg Oral QID  . ipratropium-albuterol  3 mL Nebulization TID  . lamoTRIgine  400 mg Oral QHS  . nicotine  21 mg Transdermal Daily  . predniSONE  40 mg Oral QAC breakfast  . QUEtiapine  900 mg Oral QHS  .  vancomycin  750 mg Intravenous Q8H   Continuous Infusions: . lactated ringers 75 mL/hr at 03/14/16 0232     Assessment/Plan:  1.  pneumonia-continue vancomycin and cefepime, follow respiratory viral panel, follow strep pneumo and legionella-transition to PO abx am 03/15/16 if all stable.  D/c saline 2. Acute hypoxic respiratory failure-O2 sats were in the 80s currently improved. desat to 84%.  Monitor again in am 3. Presumed COPD-smoker. Continue DuoNeb and albuterol. Started on Solu-Medrol and 80, continue with prednisone 40 daily.  Will need outpatient follow. Continue nicotine patch. 4. Bipolar, suicidality-continue Seroquel XL 900, hydroxyzine 50 3 times a day when necessary, Prozac 80, BuSpar 10 3 times a day, lamictal 400 daily at bedtime-needs outpatient follow-up with psychiatrist   5. Neuropathy/chronic in continue Augmentin 800 4 times a day    Full code, No family present, Continue to monitor as inpatient Likely can discharge home in 24 hr days  Pleas KochJai Dmari Schubring, MD  [facility] Pager [phone] 03/14/2016, 3:49 PM    LOS: 1 day

## 2016-03-15 ENCOUNTER — Encounter: Payer: Self-pay | Admitting: Family Medicine

## 2016-03-15 LAB — CBC WITH DIFFERENTIAL/PLATELET
Basophils Absolute: 0 10*3/uL (ref 0.0–0.1)
Basophils Relative: 0 %
EOS ABS: 0 10*3/uL (ref 0.0–0.7)
EOS PCT: 0 %
HCT: 27.4 % — ABNORMAL LOW (ref 39.0–52.0)
HEMOGLOBIN: 9 g/dL — AB (ref 13.0–17.0)
LYMPHS ABS: 1.2 10*3/uL (ref 0.7–4.0)
Lymphocytes Relative: 12 %
MCH: 27.3 pg (ref 26.0–34.0)
MCHC: 32.8 g/dL (ref 30.0–36.0)
MCV: 83 fL (ref 78.0–100.0)
MONOS PCT: 5 %
Monocytes Absolute: 0.5 10*3/uL (ref 0.1–1.0)
Neutro Abs: 8.2 10*3/uL — ABNORMAL HIGH (ref 1.7–7.7)
Neutrophils Relative %: 83 %
PLATELETS: 195 10*3/uL (ref 150–400)
RBC: 3.3 MIL/uL — ABNORMAL LOW (ref 4.22–5.81)
RDW: 16.9 % — ABNORMAL HIGH (ref 11.5–15.5)
WBC: 9.9 10*3/uL (ref 4.0–10.5)

## 2016-03-15 LAB — BASIC METABOLIC PANEL
Anion gap: 7 (ref 5–15)
BUN: 15 mg/dL (ref 6–20)
CHLORIDE: 100 mmol/L — AB (ref 101–111)
CO2: 24 mmol/L (ref 22–32)
CREATININE: 0.81 mg/dL (ref 0.61–1.24)
Calcium: 8.6 mg/dL — ABNORMAL LOW (ref 8.9–10.3)
GFR calc Af Amer: >60 mL/min (ref >60)
GFR calc non Af Amer: >60 mL/min (ref >60)
Glucose, Bld: 113 mg/dL — ABNORMAL HIGH (ref 65–99)
Potassium: 3.7 mmol/L (ref 3.5–5.1)
SODIUM: 131 mmol/L — AB (ref 135–145)

## 2016-03-15 MED ORDER — PREDNISONE 20 MG PO TABS: 40.0000 mg | ORAL_TABLET | Freq: Every day | ORAL | 0 refills | Status: DC

## 2016-03-15 MED ORDER — LEVOFLOXACIN 500 MG PO TABS
500.0000 mg | ORAL_TABLET | Freq: Every day | ORAL | Status: DC
  Administered 2016-03-15: 500 mg via ORAL
  Filled 2016-03-15: qty 1

## 2016-03-15 MED ORDER — ALBUTEROL SULFATE (2.5 MG/3ML) 0.083% IN NEBU: 2.5000 mg | INHALATION_SOLUTION | RESPIRATORY_TRACT | 12 refills | Status: DC | PRN

## 2016-03-15 MED ORDER — BENZONATATE 200 MG PO CAPS: 200.0000 mg | ORAL_CAPSULE | Freq: Three times a day (TID) | ORAL | 0 refills | Status: DC | PRN

## 2016-03-15 MED ORDER — DOXYCYCLINE HYCLATE 100 MG PO CAPS: 100.0000 mg | ORAL_CAPSULE | Freq: Two times a day (BID) | ORAL | 0 refills | Status: DC

## 2016-03-15 MED ORDER — CIPROFLOXACIN HCL 500 MG PO TABS: 500.0000 mg | ORAL_TABLET | Freq: Two times a day (BID) | ORAL | 0 refills | Status: DC

## 2016-03-15 NOTE — Progress Notes (Addendum)
SATURATION QUALIFICATIONS: (This note is used to comply with regulatory documentation for home oxygen)  Patient Saturations on Room Air at Rest = 88%  Patient Saturations on Room Air while Ambulating = 80%  Patient Saturations on 2 Liters of oxygen while Ambulating = 89%  Please briefly explain why patient needs home oxygen: pt spo2% never went above 89% while ambulating and being placed back on Oxygen.  Spoke with Dr Mahala MenghiniSamtani about saturations while walking and pt discharge plans.

## 2016-03-15 NOTE — Progress Notes (Signed)
Pt discharged from hospital.  Pt is requesting to not stay in room, wishes to wait for ride in lobby. Pt is taken to lobby via wheelchair.

## 2016-03-15 NOTE — Discharge Summary (Signed)
Physician Discharge Summary  Chad Booth ZOX:[id number] DOB: 1970-11-29 DOA: 03/12/2016  PCP: No PCP Per Patient  Admit date: 03/12/2016 Discharge date: 03/15/2016  Time spent: 35 minutes  Recommendations for Outpatient Follow-up:  1. To complete cipro 500 bid 2. Complete prednisone dosed for 7 days 3. Needs PCP 4. High risk for re-admit  Discharge Diagnoses:  Principal Problem:   Recurrent pneumonia Active Problems:   Anemia   CAD (coronary artery disease)   Substance abuse   Tobacco use disorder   CAP (community acquired pneumonia)   Discharge Condition: fair only  Diet recommendation: hh low salt  Filed Weights   03/12/16 1229 03/12/16 1727  Weight: 74.8 kg (165 lb) 78.2 kg (172 lb 4.8 oz)    History of present illness:  46 y/o ? Known history of suicidality, borderline personality, bipolar and lithium overdose 10/2013,  prior ethanolism-sober 2 years Recently treated for pneumonia x 2 over Christmas and then subsequently 02/19/78  Came back to the emergency room 1/31 Found to have pulse ox in the 48s increasing heart rate respiratory rate lactic acid mildly elevated and recurrent pneumonia  Hospital Course:    1. Partially rx pneumonia-was on vancomycin and cefepime-changed over to po Cipro as more affordable [cites wasn't able to get azithromycin at last d/c from hospital] which led to worsening PNA 2. Acute hypoxic respiratory failure-O2 sats were in the 80s currently improved. desat to 84%. he is insistent on leaving 03/15/16--I have cautioned him to stay out of work until 03/19/16 and to get all meds filled and quit smoking.  He is at high risk for readmission and increased mortality risk 3. Presumed COPD-smoker. Continue DuoNeb and albuterol. Started on Solu-Medrol 80, continue with prednisone 40 daily.   Continue nicotine patch. 4. Bipolar, suicidality-continue Seroquel XL 900, hydroxyzine 50 3 times a day when necessary, Prozac 80, BuSpar 10 3 times a day,  lamictal 400 daily at bedtime-needs outpatient follow-up with psychiatrist              5. Neuropathy/chronic in continue gabapentin 800 4 times a day    Discharge Exam: Vitals:   03/14/16 2010 03/15/16 0558  BP: 107/70 (!) 93/44  Pulse: 89 87  Resp: 20 20  Temp: 98.6 F (37 C) 98.7 F (37.1 C)    General: alert pleasant oriented and in some RR distress-sats in 80's while walking 20 feet Cardiovascular: s1 s2 no m/r/g Respiratory: no wheeze no rales nor ronchi abd soft  Discharge Instructions   Discharge Instructions    Diet - low sodium heart healthy    Complete by:  As directed    Discharge instructions    Complete by:  As directed    Recommend slow progression with activity given the fact that we cannot get your oxygen and he may require this. He oxygen levels stopped during this admission We will discharge with 5 more days of an oral antibiotic to be taken twice a day and 7 days of steroid and you should completely quit smoking   Increase activity slowly    Complete by:  As directed      Current Discharge Medication List    START taking these medications   Details  albuterol (PROVENTIL) (2.5 MG/3ML) 0.083% nebulizer solution Take 3 mLs (2.5 mg total) by nebulization every 2 (two) hours as needed for wheezing or shortness of breath. Qty: 75 mL, Refills: 12    benzonatate (TESSALON) 200 MG capsule Take 1 capsule (200 mg total) by mouth  3 (three) times daily as needed for cough. Qty: 20 capsule, Refills: 0    ciprofloxacin (CIPRO) 500 MG tablet Take 1 tablet (500 mg total) by mouth 2 (two) times daily. Qty: 10 tablet, Refills: 0    predniSONE (DELTASONE) 20 MG tablet Take 2 tablets (40 mg total) by mouth daily before breakfast. Qty: 7 tablet, Refills: 0      CONTINUE these medications which have NOT CHANGED   Details  busPIRone (BUSPAR) 10 MG tablet Take 10 mg by mouth 3 (three) times daily. Refills: 0    gabapentin (NEURONTIN) 400 MG capsule Take 800 mg by  mouth 4 (four) times daily. Refills: 0    hydrOXYzine (ATARAX/VISTARIL) 50 MG tablet Take 50 mg by mouth 3 (three) times daily as needed for anxiety.  Refills: 0    lamoTRIgine (LAMICTAL) 200 MG tablet Take 400 mg by mouth at bedtime. Refills: 0    PROZAC 20 MG capsule Take 80 mg by mouth daily.  Refills: 0    QUEtiapine (SEROQUEL XR) 300 MG 24 hr tablet Take 900 mg by mouth at bedtime.       No Known Allergies    The results of significant diagnostics from this hospitalization (including imaging, microbiology, ancillary and laboratory) are listed below for reference.    Significant Diagnostic Studies: Dg Chest 2 View  Result Date: 02/20/2016 CLINICAL DATA:  Recent pneumonia with persistent shortness of breath and cough EXAM: CHEST  2 VIEW COMPARISON:  February 06, 2016 FINDINGS: Patchy opacity remains in the right base, slightly less than on prior study. There is subtle increased opacity in the left perihilar region. Lungs elsewhere are clear. Heart size and pulmonary vascularity are normal. No adenopathy. No bone lesions. IMPRESSION: Patchy opacity in the right base, slightly less than on prior study. New small area of air patchy opacity left perihilar region. Suspect a degree of pneumonia in each of these areas. Lungs elsewhere clear. Stable cardiac silhouette. Followup PA and lateral chest radiographs recommended in 3-4 weeks following trial of antibiotic therapy to ensure resolution and exclude underlying malignancy. Electronically Signed   By: Bretta Bang III M.D.   On: 02/20/2016 16:08   Ct Angio Chest Pe W/cm &/or Wo Cm  Result Date: 03/12/2016 CLINICAL DATA:  Cough, shortness breath and weakness over the last 2 months. EXAM: CT ANGIOGRAPHY CHEST WITH CONTRAST TECHNIQUE: Multidetector CT imaging of the chest was performed using the standard protocol during bolus administration of intravenous contrast. Multiplanar CT image reconstructions and MIPs were obtained to evaluate  the vascular anatomy. CONTRAST:  100 cc Isovue 370 COMPARISON:  Radiography 02/20/2016 FINDINGS: Cardiovascular: Pulmonary arterial opacification is excellent. There are no pulmonary emboli. No aortic pathology. Extensive coronary artery calcification, premature for age. Mediastinum/Nodes: No mass or lymphadenopathy. Lungs/Pleura: Hazy bilateral bronchopneumonia extensively throughout both lungs, lower lobe predominant. Whereas infectious pneumonia is favored, inhalational or allergic lung disease is possible. No pleural fluid. Upper Abdomen: Normal Musculoskeletal: Ordinary degenerative changes affect the spine. Old minor superior endplate compression fracture at T4. Review of the MIP images confirms the above findings. IMPRESSION: No pulmonary emboli. Patchy hazy airspace density within both lungs, lower lobe predominant. Bronchopneumonia statistically most likely. However, the differential diagnosis does include inhalational and allergic lung disease. Premature coronary artery calcification. Electronically Signed   By: Paulina Fusi M.D.   On: 03/12/2016 15:16    Microbiology: Recent Results (from the past 240 hour(s))  Respiratory Panel by PCR     Status: None   Collection  Time: 03/12/16  7:39 PM  Result Value Ref Range Status   Adenovirus NOT DETECTED NOT DETECTED Final   Coronavirus 229E NOT DETECTED NOT DETECTED Final   Coronavirus HKU1 NOT DETECTED NOT DETECTED Final   Coronavirus NL63 NOT DETECTED NOT DETECTED Final   Coronavirus OC43 NOT DETECTED NOT DETECTED Final   Metapneumovirus NOT DETECTED NOT DETECTED Final   Rhinovirus / Enterovirus NOT DETECTED NOT DETECTED Final   Influenza A NOT DETECTED NOT DETECTED Final   Influenza B NOT DETECTED NOT DETECTED Final   Parainfluenza Virus 1 NOT DETECTED NOT DETECTED Final   Parainfluenza Virus 2 NOT DETECTED NOT DETECTED Final   Parainfluenza Virus 3 NOT DETECTED NOT DETECTED Final   Parainfluenza Virus 4 NOT DETECTED NOT DETECTED Final    Respiratory Syncytial Virus NOT DETECTED NOT DETECTED Final   Bordetella pertussis NOT DETECTED NOT DETECTED Final   Chlamydophila pneumoniae NOT DETECTED NOT DETECTED Final   Mycoplasma pneumoniae NOT DETECTED NOT DETECTED Final    Comment: Performed at [facility], [address] [place], [place] [address]  Culture, blood (routine x 2) Call MD if unable to obtain prior to antibiotics being given     Status: None (Preliminary result)   Collection Time: 03/12/16  7:45 PM  Result Value Ref Range Status   Specimen Description RIGHT ANTECUBITAL  Final   Special Requests BOTTLES DRAWN AEROBIC AND ANAEROBIC 6CC  Final   Culture NO GROWTH 2 DAYS  Final   Report Status PENDING  Incomplete  Culture, blood (routine x 2) Call MD if unable to obtain prior to antibiotics being given     Status: None (Preliminary result)   Collection Time: 03/12/16  7:58 PM  Result Value Ref Range Status   Specimen Description BLOOD LEFT HAND  Final   Special Requests BOTTLES DRAWN AEROBIC AND ANAEROBIC 6CC  Final   Culture NO GROWTH 2 DAYS  Final   Report Status PENDING  Incomplete     Labs: Basic Metabolic Panel:  Recent Labs Lab 03/12/16 1257 03/13/16 0459 03/14/16 0504 03/15/16 0835  NA 134* 136 135 131*  K 3.8 4.6 3.9 3.7  CL 102 105 106 100*  CO2 24 23 23 24   GLUCOSE 94 129* 117* 113*  BUN 13 14 16 15   CREATININE 1.13 0.91 0.79 0.81  CALCIUM 9.1 8.6* 8.2* 8.6*   Liver Function Tests: No results for input(s): AST, ALT, ALKPHOS, BILITOT, PROT, ALBUMIN in the last 168 hours. No results for input(s): LIPASE, AMYLASE in the last 168 hours. No results for input(s): AMMONIA in the last 168 hours. CBC:  Recent Labs Lab 03/12/16 1257 03/13/16 0459 03/15/16 0835  WBC 9.6 15.7* 9.9  NEUTROABS 8.1* 14.3* 8.2*  HGB 10.2* 9.4* 9.0*  HCT 30.7* 28.3* 27.4*  MCV 82.7 83.7 83.0  PLT 218 241 195   Cardiac Enzymes:  Recent Labs Lab 03/12/16 1257  TROPONINI <0.03   BNP: BNP (last 3  results) No results for input(s): BNP in the last 8760 hours.  ProBNP (last 3 results) No results for input(s): PROBNP in the last 8760 hours.  CBG:  Recent Labs Lab 03/12/16 1232  GLUCAP 97       Signed:  Rhetta MuraSAMTANI, JAI-GURMUKH MD   [facility] 03/15/2016, 11:17 AM

## 2016-03-15 NOTE — Progress Notes (Signed)
Pt IV out, tolerated well.  Reviewed discharge instructions with pt and answered all questions at this time.

## 2016-03-16 LAB — QUANTIFERON IN TUBE
QFT TB AG MINUS NIL VALUE: 0 [IU]/mL
QUANTIFERON MITOGEN VALUE: 0.06 [IU]/mL
QUANTIFERON NIL VALUE: 0.03 [IU]/mL
QUANTIFERON TB AG VALUE: 0.03 IU/mL
QUANTIFERON TB GOLD: UNDETERMINED

## 2016-03-16 LAB — QUANTIFERON TB GOLD ASSAY (BLOOD)

## 2016-03-17 LAB — CULTURE, BLOOD (ROUTINE X 2)
CULTURE: NO GROWTH
Culture: NO GROWTH

## 2017-07-22 ENCOUNTER — Encounter: Payer: Self-pay | Admitting: Physician Assistant

## 2017-07-22 ENCOUNTER — Ambulatory Visit: Payer: Self-pay | Admitting: Physician Assistant

## 2017-07-22 VITALS — BP 125/78 | HR 96 | Temp 98.2°F | Ht 69.75 in | Wt 165.5 lb

## 2017-07-22 DIAGNOSIS — F39 Unspecified mood [affective] disorder: Secondary | ICD-10-CM

## 2017-07-22 DIAGNOSIS — Z131 Encounter for screening for diabetes mellitus: Secondary | ICD-10-CM

## 2017-07-22 DIAGNOSIS — Z7689 Persons encountering health services in other specified circumstances: Secondary | ICD-10-CM

## 2017-07-22 DIAGNOSIS — F1011 Alcohol abuse, in remission: Secondary | ICD-10-CM

## 2017-07-22 DIAGNOSIS — F17200 Nicotine dependence, unspecified, uncomplicated: Secondary | ICD-10-CM

## 2017-07-22 DIAGNOSIS — Z862 Personal history of diseases of the blood and blood-forming organs and certain disorders involving the immune mechanism: Secondary | ICD-10-CM

## 2017-07-22 DIAGNOSIS — I2510 Atherosclerotic heart disease of native coronary artery without angina pectoris: Secondary | ICD-10-CM

## 2017-07-22 DIAGNOSIS — Z13220 Encounter for screening for lipoid disorders: Secondary | ICD-10-CM

## 2017-07-22 NOTE — Progress Notes (Signed)
BP 125/78 (BP Location: Right Arm, Patient Position: Sitting, Cuff Size: Normal)   Pulse 96   Temp 98.2 F (36.8 C)   Ht 5' 9.75" (1.772 m)   Wt 165 lb 8 oz (75.1 kg)   SpO2 97%   BMI 23.92 kg/m    Subjective:    Patient ID: Chad RamusSteven J Supak, male    DOB: August 31, 1970, [age] y.o.   MRN: [id number]  HPI: Chad Booth is a 47 y.o. male presenting on 07/22/2017 for New Patient (Initial Visit) (pt [facility]. pt [place] last seen by pcp 15 yearsa ago)   HPI   Chief Complaint  Patient presents with  . New Patient (Initial Visit)    pt [facility]. pt states [place] pt last seen by pcp 15 yearsa ago     Pt says he only came in because he wants dental.  He says Albin FellingCarla told him the dentist would give him dentures.    Pt was heavy etoh for 25 years. He says he has not drank etoh in 4 years.  CT done last year showed extensive coronary artery calcification  Pt says he feels well and he has no complaints- he just wants dental.  [facility] goes to [facility] issues  Relevant past medical, surgical, family and social history reviewed and updated as indicated. Interim medical history since our last visit reviewed. Allergies and medications reviewed and updated.   Current Outpatient Medications:  .  busPIRone (BUSPAR) 10 MG tablet, Take 10 mg by mouth 3 (three) times daily., Disp: , Rfl: 0 .  gabapentin (NEURONTIN) 600 MG tablet, Take 600 mg by mouth 3 (three) times daily., Disp: , Rfl:  .  hydrOXYzine (ATARAX/VISTARIL) 50 MG tablet, Take 50 mg by mouth 3 (three) times daily as needed for anxiety. , Disp: , Rfl: 0 .  lamoTRIgine (LAMICTAL) 200 MG tablet, Take 400 mg by mouth at bedtime., Disp: , Rfl: 0 .  PROZAC 20 MG capsule, Take 20 mg by mouth daily. , Disp: , Rfl: 0 .  QUEtiapine (SEROQUEL XR) 300 MG 24 hr tablet, Take 600 mg by mouth at bedtime. , Disp: , Rfl:    Review of Systems  Constitutional: Negative for appetite change, chills, diaphoresis, fatigue, fever and unexpected weight change.  HENT: Positive for dental problem. Negative for congestion, drooling, ear pain, facial swelling, hearing loss, mouth sores, sneezing, sore throat, trouble swallowing and voice change.   Eyes: Negative for pain, discharge, redness, itching and visual disturbance.  Respiratory: Negative for cough, choking, shortness of breath and wheezing.   Cardiovascular: Negative for chest pain, palpitations and leg swelling.  Gastrointestinal: Negative for abdominal pain, blood in stool, constipation, diarrhea and vomiting.  Endocrine: Negative for cold intolerance, heat intolerance and polydipsia.  Genitourinary: Negative for decreased urine volume, dysuria and hematuria.  Musculoskeletal: Negative for arthralgias, back pain and gait problem.  Skin: Negative for rash.  Allergic/Immunologic: Negative for environmental allergies.  Neurological: Negative for seizures, syncope, light-headedness and headaches.  Hematological: Negative for adenopathy.  Psychiatric/Behavioral: Negative for agitation, dysphoric mood and suicidal ideas. The patient is not nervous/anxious.     Per HPI unless specifically indicated above     Objective:    BP 125/78 (BP Location: Right Arm, Patient Position: Sitting, Cuff Size: Normal)   Pulse 96   Temp 98.2 F (36.8 C)  Ht 5' 9.75" (1.772 m)   Wt 165 lb 8 oz (75.1 kg)   SpO2 97%   BMI 23.92 kg/m   Wt Readings from Last 3 Encounters:  07/22/17 165 lb 8 oz (75.1 kg)  03/12/16 172 lb 4.8 oz (78.2 kg)  02/20/16 165 lb (74.8 kg)    Physical Exam  Constitutional: He is oriented to person, place, and time. He appears well-developed and well-nourished.  HENT:  Head: Normocephalic and atraumatic.  Mouth/Throat: Uvula is midline and oropharynx is clear and moist. No oropharyngeal exudate.  edentulous  Eyes: Pupils are equal, round, and  reactive to light. Conjunctivae and EOM are normal.  Neck: Neck supple. No thyromegaly present.  Cardiovascular: Normal rate and regular rhythm.  Pulmonary/Chest: Effort normal and breath sounds normal. He has no wheezes. He has no rales.  Abdominal: Soft. Bowel sounds are normal. He exhibits no mass. There is no hepatosplenomegaly. There is no tenderness.  Musculoskeletal: He exhibits no edema.  Lymphadenopathy:    He has no cervical adenopathy.  Neurological: He is alert and oriented to person, place, and time.  Skin: Skin is warm and dry. No rash noted.  Psychiatric: He has a normal mood and affect. His behavior is normal. Thought content normal.  Vitals reviewed.       Assessment & Plan:   Encounter Diagnoses  Name Primary?  . Encounter to establish care Yes  . Tobacco use disorder   . Mood disorder (HCC)   . Screening cholesterol level   . Coronary artery disease involving native heart without angina pectoris, unspecified vessel or lesion type   . History of anemia   . Screening for diabetes mellitus   . Alcohol abuse, in remission     -will check baseline labs -pt to continue with [facility] for mental health issues -counseled smoking cessation -pt to dental list -pt to follow up in 1 month.  RTO sooner prn

## 2017-07-23 ENCOUNTER — Other Ambulatory Visit (HOSPITAL_COMMUNITY)
Admission: RE | Admit: 2017-07-23 | Discharge: 2017-07-23 | Disposition: A | Discharge status: Home / Self Care | Payer: Self-pay | Source: Ambulatory Visit | Attending: Physician Assistant | Admitting: Physician Assistant

## 2017-07-23 DIAGNOSIS — Z131 Encounter for screening for diabetes mellitus: Secondary | ICD-10-CM | POA: Insufficient documentation

## 2017-07-23 DIAGNOSIS — I2510 Atherosclerotic heart disease of native coronary artery without angina pectoris: Secondary | ICD-10-CM | POA: Insufficient documentation

## 2017-07-23 DIAGNOSIS — Z862 Personal history of diseases of the blood and blood-forming organs and certain disorders involving the immune mechanism: Secondary | ICD-10-CM | POA: Insufficient documentation

## 2017-07-23 DIAGNOSIS — Z13220 Encounter for screening for lipoid disorders: Secondary | ICD-10-CM | POA: Insufficient documentation

## 2017-07-23 LAB — LIPID PANEL
Cholesterol: 166 mg/dL (ref 0–200)
HDL: 33 mg/dL — ABNORMAL LOW (ref >40)
LDL CALC: 120 mg/dL — AB (ref 0–99)
TRIGLYCERIDES: 64 mg/dL (ref <150)
Total CHOL/HDL Ratio: 5 RATIO
VLDL: 13 mg/dL (ref 0–40)

## 2017-07-23 LAB — CBC
HCT: 43.5 % (ref 39.0–52.0)
Hemoglobin: 14.9 g/dL (ref 13.0–17.0)
MCH: 32 pg (ref 26.0–34.0)
MCHC: 34.3 g/dL (ref 30.0–36.0)
MCV: 93.3 fL (ref 78.0–100.0)
PLATELETS: 164 10*3/uL (ref 150–400)
RBC: 4.66 MIL/uL (ref 4.22–5.81)
RDW: 14 % (ref 11.5–15.5)
WBC: 7 10*3/uL (ref 4.0–10.5)

## 2017-07-23 LAB — COMPREHENSIVE METABOLIC PANEL
ALT: 9 U/L — AB (ref 17–63)
AST: 14 U/L — ABNORMAL LOW (ref 15–41)
Albumin: 4.4 g/dL (ref 3.5–5.0)
Alkaline Phosphatase: 81 U/L (ref 38–126)
Anion gap: 7 (ref 5–15)
BUN: 14 mg/dL (ref 6–20)
CALCIUM: 9.4 mg/dL (ref 8.9–10.3)
CO2: 25 mmol/L (ref 22–32)
CREATININE: 0.94 mg/dL (ref 0.61–1.24)
Chloride: 107 mmol/L (ref 101–111)
GFR calc non Af Amer: >60 mL/min (ref >60)
Glucose, Bld: 97 mg/dL (ref 65–99)
Potassium: 4.2 mmol/L (ref 3.5–5.1)
Sodium: 139 mmol/L (ref 135–145)
Total Bilirubin: 0.8 mg/dL (ref 0.3–1.2)
Total Protein: 7.6 g/dL (ref 6.5–8.1)

## 2017-07-23 LAB — HEMOGLOBIN A1C
Hgb A1c MFr Bld: 3.6 % — ABNORMAL LOW (ref 4.8–5.6)
MEAN PLASMA GLUCOSE: 56.62 mg/dL

## 2017-08-20 ENCOUNTER — Encounter: Payer: Self-pay | Admitting: Physician Assistant

## 2017-08-20 ENCOUNTER — Ambulatory Visit: Payer: Self-pay | Admitting: Physician Assistant

## 2017-08-20 VITALS — BP 104/64 | HR 105 | Temp 97.7°F | Ht 69.75 in | Wt 162.5 lb

## 2017-08-20 DIAGNOSIS — I2510 Atherosclerotic heart disease of native coronary artery without angina pectoris: Secondary | ICD-10-CM

## 2017-08-20 DIAGNOSIS — E785 Hyperlipidemia, unspecified: Secondary | ICD-10-CM

## 2017-08-20 DIAGNOSIS — F39 Unspecified mood [affective] disorder: Secondary | ICD-10-CM

## 2017-08-20 DIAGNOSIS — F17200 Nicotine dependence, unspecified, uncomplicated: Secondary | ICD-10-CM

## 2017-08-20 MED ORDER — ATORVASTATIN CALCIUM 20 MG PO TABS: 20.0000 mg | ORAL_TABLET | Freq: Every day | ORAL | 1 refills | Status: AC

## 2017-08-20 NOTE — Patient Instructions (Signed)

## 2017-08-20 NOTE — Progress Notes (Signed)
BP 104/64 (BP Location: Right Arm, Patient Position: Sitting, Cuff Size: Normal)   Pulse (!) 105   Temp 97.7 F (36.5 C) (Other (Comment))   Ht 5' 9.75" (1.772 m)   Wt 162 lb 8 oz (73.7 kg)   SpO2 96%   BMI 23.48 kg/m    Subjective:    Patient ID: Chad Booth, male    DOB: [birth year], 47 y.o.   MRN: [id number]  HPI: Chad Booth is a 47 y.o. male presenting on 08/20/2017 for Follow-up   HPI    Pt is planning to move to [place] in 6 months.   Pt still going to [facility] for MH issues.  Pt is taking a multitude of OTC supplements.    Relevant past medical, surgical, family and social history reviewed and updated as indicated. Interim medical history since our last visit reviewed. Allergies and medications reviewed and updated.  CURRENT MEDS: sunflower lecithin Fish oil bacopa  acetyl carnitnin  reservatrol buspar Gabapentin Atarax lamictal prozac seroquel   Review of Systems  Constitutional: Negative for appetite change, chills, diaphoresis, fatigue, fever and unexpected weight change.  HENT: Negative for congestion, dental problem, drooling, ear pain, facial swelling, hearing loss, mouth sores, sneezing, sore throat, trouble swallowing and voice change.   Eyes: Negative for pain, discharge, redness, itching and visual disturbance.  Respiratory: Negative for cough, choking, shortness of breath and wheezing.   Cardiovascular: Negative for chest pain, palpitations and leg swelling.  Gastrointestinal: Negative for abdominal pain, blood in stool, constipation, diarrhea and vomiting.  Endocrine: Negative for cold intolerance, heat intolerance and polydipsia.  Genitourinary: Negative for decreased urine volume, dysuria and hematuria.  Musculoskeletal: Negative for arthralgias, back pain and gait problem.  Skin: Negative for rash.  Allergic/Immunologic: Negative for environmental allergies.  Neurological: Negative for seizures, syncope, light-headedness  and headaches.  Hematological: Negative for adenopathy.  Psychiatric/Behavioral: Negative for agitation, dysphoric mood and suicidal ideas. The patient is not nervous/anxious.     Per HPI unless specifically indicated above     Objective:    BP 104/64 (BP Location: Right Arm, Patient Position: Sitting, Cuff Size: Normal)   Pulse (!) 105   Temp 97.7 F (36.5 C) (Other (Comment))   Ht 5' 9.75" (1.772 m)   Wt 162 lb 8 oz (73.7 kg)   SpO2 96%   BMI 23.48 kg/m   Wt Readings from Last 3 Encounters:  08/20/17 162 lb 8 oz (73.7 kg)  07/22/17 165 lb 8 oz (75.1 kg)  03/12/16 172 lb 4.8 oz (78.2 kg)    Physical Exam  Constitutional: He is oriented to person, place, and time. He appears well-developed and well-nourished.  HENT:  Head: Normocephalic and atraumatic.  Neck: Neck supple.  Cardiovascular: Normal rate and regular rhythm.  Pulmonary/Chest: Effort normal and breath sounds normal. He has no wheezes.  Abdominal: Soft. Bowel sounds are normal. There is no hepatosplenomegaly. There is no tenderness.  Musculoskeletal: He exhibits no edema.  Lymphadenopathy:    He has no cervical adenopathy.  Neurological: He is alert and oriented to person, place, and time.  Skin: Skin is warm and dry.  Psychiatric: He has a normal mood and affect. His behavior is normal.  Vitals reviewed.   Results for orders placed or performed during the hospital encounter of 07/23/17  Comprehensive metabolic panel  Result Value Ref Range   Sodium 139 135 - 145 mmol/L   Potassium 4.2 3.5 - 5.1 mmol/L   Chloride 107 101 - 111 mmol/L  CO2 25 22 - 32 mmol/L   Glucose, Bld 97 65 - 99 mg/dL   BUN 14 6 - 20 mg/dL   Creatinine, Ser 1.910.94 0.61 - 1.24 mg/dL   Calcium 9.4 8.9 - 47.810.3 mg/dL   Total Protein 7.6 6.5 - 8.1 g/dL   Albumin 4.4 3.5 - 5.0 g/dL   AST 14 (L) 15 - 41 U/L   ALT 9 (L) 17 - 63 U/L   Alkaline Phosphatase 81 38 - 126 U/L   Total Bilirubin 0.8 0.3 - 1.2 mg/dL   GFR calc non Af Amer >60 >60  mL/min   GFR calc Af Amer >60 >60 mL/min   Anion gap 7 5 - 15  Lipid panel  Result Value Ref Range   Cholesterol 166 0 - 200 mg/dL   Triglycerides 64 <295<150 mg/dL   HDL 33 (L) >62>40 mg/dL   Total CHOL/HDL Ratio 5.0 RATIO   VLDL 13 0 - 40 mg/dL   LDL Cholesterol 130120 (H) 0 - 99 mg/dL  Hemoglobin Q6VA1c  Result Value Ref Range   Hgb A1c MFr Bld 3.6 (L) 4.8 - 5.6 %   Mean Plasma Glucose 56.62 mg/dL  CBC  Result Value Ref Range   WBC 7.0 4.0 - 10.5 K/uL   RBC 4.66 4.22 - 5.81 MIL/uL   Hemoglobin 14.9 13.0 - 17.0 g/dL   HCT 78.443.5 69.639.0 - 29.552.0 %   MCV 93.3 78.0 - 100.0 fL   MCH 32.0 26.0 - 34.0 pg   MCHC 34.3 30.0 - 36.0 g/dL   RDW 28.414.0 13.211.5 - 44.015.5 %   Platelets 164 150 - 400 K/uL      Assessment & Plan:    Encounter Diagnoses  Name Primary?  . Hyperlipidemia, unspecified hyperlipidemia type Yes  . Coronary artery disease involving native heart without angina pectoris, unspecified vessel or lesion type   . Mood disorder (HCC)   . Tobacco use disorder     -Reviewed labs with pt -will Start atorvastatin and lowfat diet for lipids -Counseled that his supplements are unnecessary and possibly harmful -pt to Follow up 3 months.  RTO sooner prn

## 2017-11-19 ENCOUNTER — Ambulatory Visit: Payer: Self-pay | Admitting: Physician Assistant

## 2017-12-01 ENCOUNTER — Encounter: Payer: Self-pay | Admitting: Physician Assistant

## 2019-09-20 ENCOUNTER — Emergency Department (HOSPITAL_COMMUNITY)
Admission: EM | Admit: 2019-09-20 | Discharge: 2019-09-20 | Disposition: A | Discharge status: Home / Self Care | Payer: Self-pay

## 2019-09-20 ENCOUNTER — Other Ambulatory Visit: Payer: Self-pay

## 2019-12-30 ENCOUNTER — Emergency Department (HOSPITAL_COMMUNITY): Payer: Self-pay

## 2019-12-30 ENCOUNTER — Other Ambulatory Visit: Payer: Self-pay

## 2019-12-30 ENCOUNTER — Encounter (HOSPITAL_COMMUNITY): Payer: Self-pay

## 2019-12-30 ENCOUNTER — Emergency Department (HOSPITAL_COMMUNITY)
Admission: EM | Admit: 2019-12-30 | Discharge: 2019-12-30 | Disposition: A | Discharge status: Home / Self Care | Payer: Self-pay | Attending: Emergency Medicine | Admitting: Emergency Medicine

## 2019-12-30 DIAGNOSIS — R079 Chest pain, unspecified: Secondary | ICD-10-CM | POA: Insufficient documentation

## 2019-12-30 DIAGNOSIS — Z79899 Other long term (current) drug therapy: Secondary | ICD-10-CM | POA: Insufficient documentation

## 2019-12-30 DIAGNOSIS — Z5321 Procedure and treatment not carried out due to patient leaving prior to being seen by health care provider: Secondary | ICD-10-CM

## 2019-12-30 DIAGNOSIS — F17210 Nicotine dependence, cigarettes, uncomplicated: Secondary | ICD-10-CM | POA: Insufficient documentation

## 2019-12-30 DIAGNOSIS — I2510 Atherosclerotic heart disease of native coronary artery without angina pectoris: Secondary | ICD-10-CM | POA: Insufficient documentation

## 2019-12-30 LAB — CBC WITH DIFFERENTIAL/PLATELET
Abs Immature Granulocytes: 0.01 10*3/uL (ref 0.00–0.07)
Basophils Absolute: 0 10*3/uL (ref 0.0–0.1)
Basophils Relative: 1 %
Eosinophils Absolute: 0.1 10*3/uL (ref 0.0–0.5)
Eosinophils Relative: 2 %
HCT: 43.7 % (ref 39.0–52.0)
Hemoglobin: 15.2 g/dL (ref 13.0–17.0)
Immature Granulocytes: 0 %
Lymphocytes Relative: 44 %
Lymphs Abs: 2.8 10*3/uL (ref 0.7–4.0)
MCH: 33.3 pg (ref 26.0–34.0)
MCHC: 34.8 g/dL (ref 30.0–36.0)
MCV: 95.6 fL (ref 80.0–100.0)
Monocytes Absolute: 0.5 10*3/uL (ref 0.1–1.0)
Monocytes Relative: 7 %
Neutro Abs: 3 10*3/uL (ref 1.7–7.7)
Neutrophils Relative %: 46 %
Platelets: 229 10*3/uL (ref 150–400)
RBC: 4.57 MIL/uL (ref 4.22–5.81)
RDW: 16.4 % — ABNORMAL HIGH (ref 11.5–15.5)
WBC: 6.4 10*3/uL (ref 4.0–10.5)
nRBC: 0 % (ref 0.0–0.2)

## 2019-12-30 LAB — COMPREHENSIVE METABOLIC PANEL
ALT: 44 U/L (ref 0–44)
AST: 82 U/L — ABNORMAL HIGH (ref 15–41)
Albumin: 4.2 g/dL (ref 3.5–5.0)
Alkaline Phosphatase: 76 U/L (ref 38–126)
Anion gap: 12 (ref 5–15)
BUN: 10 mg/dL (ref 6–20)
CO2: 22 mmol/L (ref 22–32)
Calcium: 8.6 mg/dL — ABNORMAL LOW (ref 8.9–10.3)
Chloride: 107 mmol/L (ref 98–111)
Creatinine, Ser: 0.61 mg/dL (ref 0.61–1.24)
GFR, Estimated: >60 mL/min (ref >60)
Glucose, Bld: 116 mg/dL — ABNORMAL HIGH (ref 70–99)
Potassium: 3.2 mmol/L — ABNORMAL LOW (ref 3.5–5.1)
Sodium: 141 mmol/L (ref 135–145)
Total Bilirubin: 1 mg/dL (ref 0.3–1.2)
Total Protein: 8.2 g/dL — ABNORMAL HIGH (ref 6.5–8.1)

## 2019-12-30 LAB — D-DIMER, QUANTITATIVE: D-Dimer, Quant: <0.27 ug/mL-FEU (ref 0.00–0.50)

## 2019-12-30 LAB — ETHANOL: Alcohol, Ethyl (B): 317 mg/dL (ref <10)

## 2019-12-30 LAB — TROPONIN I (HIGH SENSITIVITY): Troponin I (High Sensitivity): 5 ng/L (ref <18)

## 2019-12-30 MED ORDER — LORAZEPAM 1 MG PO TABS
1.0000 mg | ORAL_TABLET | Freq: Once | ORAL | Status: AC
  Administered 2019-12-30: 1 mg via ORAL
  Filled 2019-12-30: qty 1

## 2019-12-30 MED ORDER — LACTATED RINGERS IV BOLUS
1000.0000 mL | Freq: Once | INTRAVENOUS | Status: AC
  Administered 2019-12-30: 1000 mL via INTRAVENOUS

## 2019-12-30 NOTE — ED Notes (Signed)
Pt persistent on wanting to leave. Discussed AMA with patient, pt still wanting to leave AMA.  EDP notified.

## 2019-12-30 NOTE — ED Triage Notes (Signed)
Pt reports chest pain that radiates to left arm, shoulder, and back x 4 days. Pt reports left leg numbness and in both feet for several weeks.

## 2019-12-30 NOTE — ED Notes (Signed)
EDP at bedside  

## 2019-12-30 NOTE — ED Provider Notes (Signed)
[facility] EMERGENCY DEPARTMENT Provider Note   CSN: [id number] Arrival date & time: 12/30/19  0244     History Chief Complaint  Patient presents with  . Chest Pain    Chad Booth is a 49 y.o. male.  The history is provided by the patient.  Chest Pain Pain location:  L chest Pain quality: sharp and stabbing   Pain radiates to:  Does not radiate Pain severity:  Mild Onset quality:  Gradual Duration:  3 days Timing:  Intermittent Progression:  Waxing and waning Chronicity:  New Relieved by:  Nothing Worsened by:  Nothing Ineffective treatments:  None tried      Past Medical History:  Diagnosis Date  . Anxiety   . Bipolar disorder (HCC)   . Depression   . OCD (obsessive compulsive disorder)     Patient Active Problem List   Diagnosis Date Noted  . CAP (community acquired pneumonia) 03/13/2016  . Recurrent pneumonia 03/12/2016  . Anemia 03/12/2016  . CAD (coronary artery disease) 03/12/2016  . Substance abuse (HCC) 03/12/2016  . Tobacco use disorder 03/12/2016    History reviewed. No pertinent surgical history.     Family History  Problem Relation Age of Onset  . Heart disease Mother   . CAD Father 44  . Heart attack Brother     Social History   Tobacco Use  . Smoking status: Current Every Day Smoker    Packs/day: 1.00    Years: 28.00    Pack years: 28.00    Types: Cigarettes  . Smokeless tobacco: Never Used  . Tobacco comment: declines patch  Vaping Use  . Vaping Use: Never used  Substance Use Topics  . Alcohol use: Yes    Comment: has not drank in a month- drank a bottle of wine tonight  . Drug use: No    Home Medications Prior to Admission medications   Medication Sig Start Date End Date Taking? Authorizing Provider  Acetylcarn-Alpha Lipoic Acid 400-200 MG CAPS Take 2 capsules by mouth daily.    [provider]  atorvastatin (LIPITOR) 20 MG tablet Take 1 tablet (20 mg total) by mouth daily. 08/20/17   Jacquelin Hawking, PA-C  busPIRone (BUSPAR) 10 MG tablet Take 10 mg by mouth 3 (three) times daily. 01/06/16   [provider]  gabapentin (NEURONTIN) 600 MG tablet Take 600 mg by mouth 3 (three) times daily.    [provider]  hydrOXYzine (ATARAX/VISTARIL) 50 MG tablet Take 50 mg by mouth 3 (three) times daily as needed for anxiety.  12/18/15   [provider]  lamoTRIgine (LAMICTAL) 200 MG tablet Take 400 mg by mouth at bedtime. 12/18/15   [provider]  Lecithin 1200 MG CAPS Take 2 capsules by mouth daily.    [provider]  omega-3 fish oil (MAXEPA) 1000 MG CAPS capsule Take 2 capsules by mouth daily.    [provider]  OVER THE COUNTER MEDICATION Take 1 capsule by mouth. Bacopa 300mg     [provider]  PROZAC 20 MG capsule Take 20 mg by mouth daily.  01/06/16   [provider]  QUEtiapine (SEROQUEL XR) 300 MG 24 hr tablet Take 600 mg by mouth at bedtime.     [provider]  Resveratrol 250 MG CAPS Take 1 capsule by mouth daily.    [provider]    Allergies    Patient has no known allergies.  Review of Systems   Review of Systems  Cardiovascular:  Positive for chest pain.  All other systems reviewed and are negative.   Physical Exam Updated Vital Signs BP (!) 136/91   Pulse (!) 114   Temp 98.1 F (36.7 C) (Oral)   Resp (!) 22   Ht 5\' 10"  (1.778 m)   Wt 70.3 kg   SpO2 98%   BMI 22.24 kg/m   Physical Exam Vitals and nursing note reviewed.  Constitutional:      Appearance: He is well-developed.  HENT:     Head: Normocephalic and atraumatic.  Cardiovascular:     Rate and Rhythm: Tachycardia present.  Pulmonary:     Effort: Pulmonary effort is normal. No respiratory distress.     Breath sounds: No decreased breath sounds.  Chest:     Chest wall: No mass, tenderness or crepitus.  Abdominal:     General: There is no distension.  Musculoskeletal:        General: Normal range of  motion.     Cervical back: Normal range of motion.  Skin:    General: Skin is warm and dry.  Neurological:     Mental Status: He is alert.     ED Results / Procedures / Treatments   Labs (all labs ordered are listed, but only abnormal results are displayed) Labs Reviewed  CBC WITH DIFFERENTIAL/PLATELET - Abnormal; Notable for the following components:      Result Value   RDW 16.4 (*)    All other components within normal limits  COMPREHENSIVE METABOLIC PANEL - Abnormal; Notable for the following components:   Potassium 3.2 (*)    Glucose, Bld 116 (*)    Calcium 8.6 (*)    Total Protein 8.2 (*)    AST 82 (*)    All other components within normal limits  ETHANOL - Abnormal; Notable for the following components:   Alcohol, Ethyl (B) 317 (*)    All other components within normal limits  D-DIMER, QUANTITATIVE (NOT AT [facility])  TROPONIN I (HIGH SENSITIVITY)    EKG None  Radiology DG Chest Portable 1 View  Result Date: 12/30/2019 CLINICAL DATA:  Chest pain radiating into the left arm and shoulder for 4 days EXAM: PORTABLE CHEST 1 VIEW COMPARISON:  02/20/2016 FINDINGS: Reticulation of lower lung markings. There is no edema, consolidation, effusion, or pneumothorax. Normal heart size and mediastinal contours IMPRESSION: Interstitial coarsening in the lower lungs which was also seen in 2018. No acute superimposed finding. Electronically Signed   By: 2019 M.D.   On: 12/30/2019 04:16    Procedures Procedures (including critical care time)  Medications Ordered in ED Medications  lactated ringers bolus 1,000 mL (0 mLs Intravenous Stopped 12/30/19 0352)  LORazepam (ATIVAN) tablet 1 mg (1 mg Oral Given 12/30/19 0324)    ED Course  I have reviewed the triage vital signs and the nursing notes.  Pertinent labs & imaging results that were available during my care of the patient were reviewed by me and considered in my medical decision making (see chart for details).    MDM  Rules/Calculators/A&P                          Suspected PE vs ACS vs anxiety. Was pending labs and workup when he eloped. Review of labs at 0516 don't reveal any indication for return.   Final Clinical Impression(s) / ED Diagnoses Final diagnoses:  Nonspecific chest pain  Eloped from emergency department    Rx / DC Orders  ED Discharge Orders    None       Haniyyah Sakuma, Barbara Cower, MD 12/30/19 [phone]

## 2021-01-22 IMAGING — DX DG CHEST 1V PORT
2 series · 2 of 2 positions shown · non-contrast
Comparison: 02/20/2016

CLINICAL DATA: Chest pain radiating into the left arm and shoulder
for 4 days

EXAM:
PORTABLE CHEST 1 VIEW

[chest ap (1 of 2)]
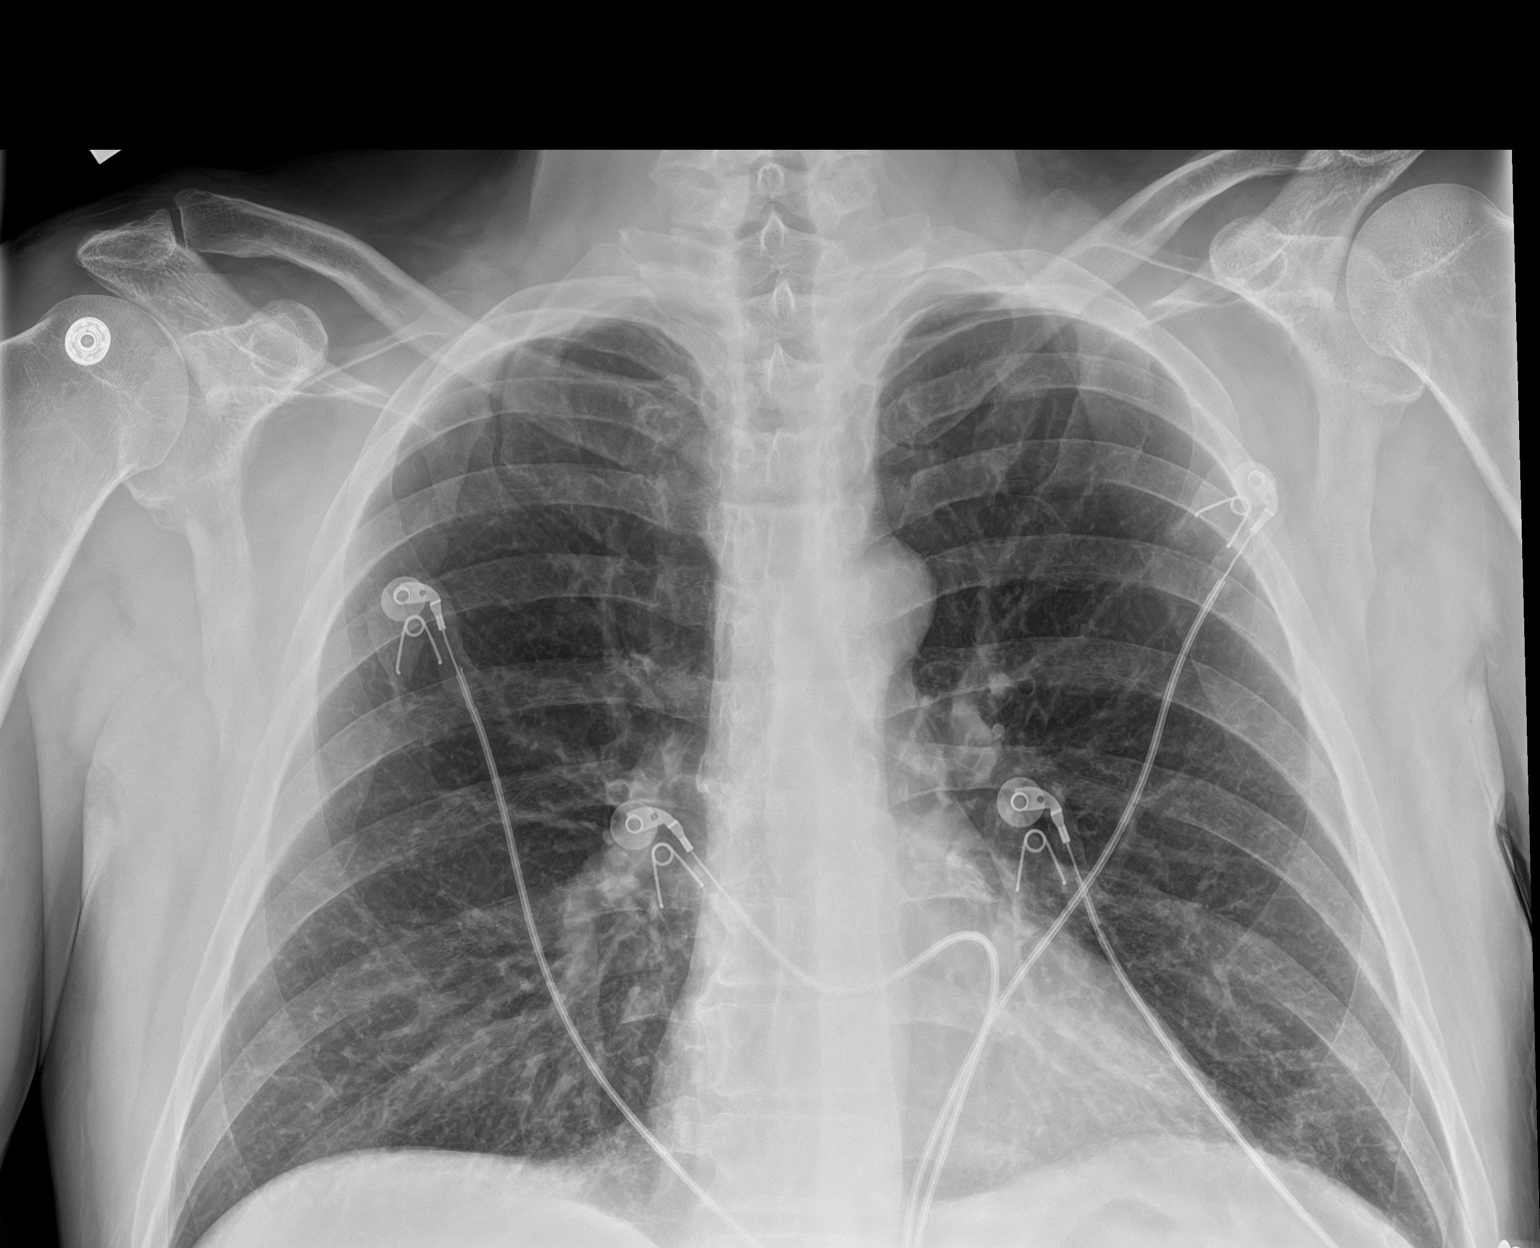

[chest ap (2 of 2)]
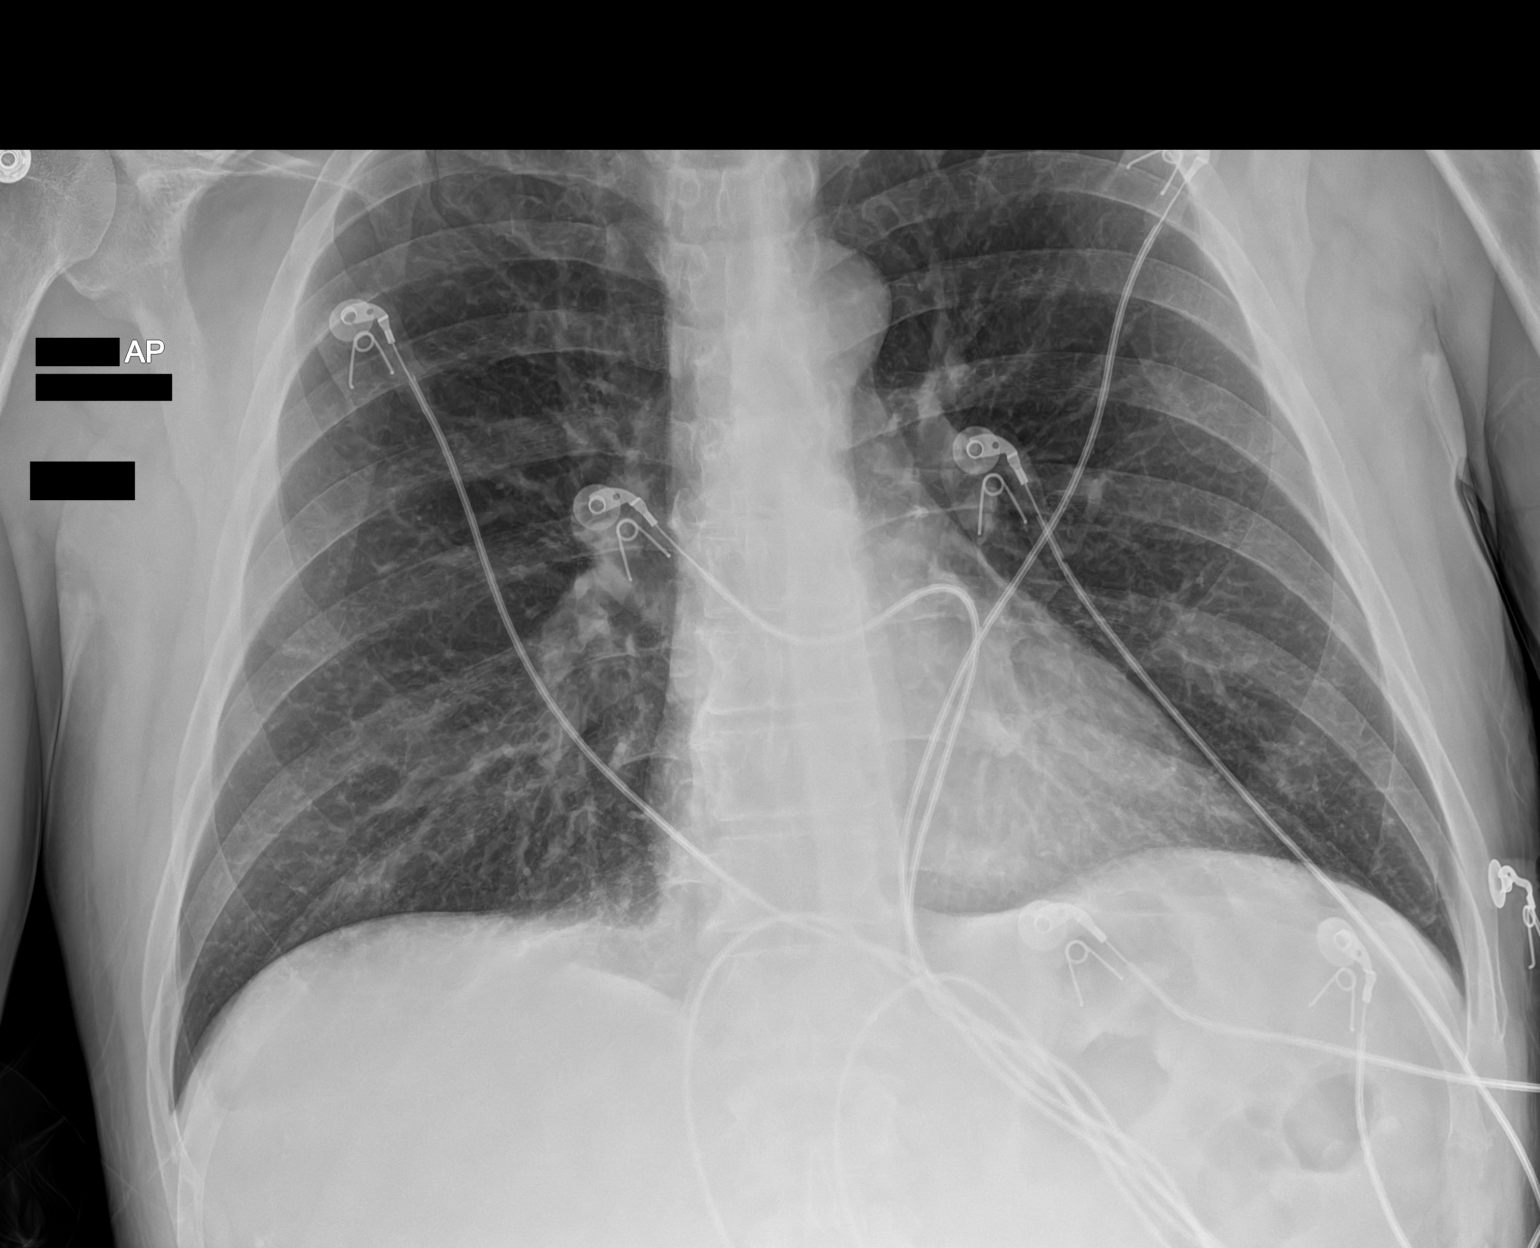

[2 of 2 positions shown; findings below may reference images not displayed]

FINDINGS: Reticulation of lower lung markings. There is no edema,
consolidation, effusion, or pneumothorax. Normal heart size and
mediastinal contours
IMPRESSION: Interstitial coarsening in the lower lungs which was also seen in
3408. No acute superimposed finding.
